# Patient Record
Sex: Male | Born: 1965 | Race: White | Hispanic: No | Marital: Married | Smoking: Never smoker
Health system: Southern US, Community
[De-identification: ages and names within clinical notes are randomized; demographics above are authoritative.]

## PROBLEM LIST (undated history)

## (undated) DIAGNOSIS — K859 Acute pancreatitis without necrosis or infection, unspecified: Secondary | ICD-10-CM

## (undated) DIAGNOSIS — I10 Essential (primary) hypertension: Secondary | ICD-10-CM

## (undated) DIAGNOSIS — Q600 Renal agenesis, unilateral: Secondary | ICD-10-CM

## (undated) DIAGNOSIS — B958 Unspecified staphylococcus as the cause of diseases classified elsewhere: Secondary | ICD-10-CM

## (undated) DIAGNOSIS — G5600 Carpal tunnel syndrome, unspecified upper limb: Secondary | ICD-10-CM

## (undated) DIAGNOSIS — E785 Hyperlipidemia, unspecified: Secondary | ICD-10-CM

## (undated) DIAGNOSIS — J189 Pneumonia, unspecified organism: Secondary | ICD-10-CM

## (undated) DIAGNOSIS — G5603 Carpal tunnel syndrome, bilateral upper limbs: Secondary | ICD-10-CM

## (undated) DIAGNOSIS — L089 Local infection of the skin and subcutaneous tissue, unspecified: Secondary | ICD-10-CM

## (undated) HISTORY — PX: VASECTOMY: SHX75

## (undated) HISTORY — DX: Acute pancreatitis without necrosis or infection, unspecified: K85.90

## (undated) HISTORY — DX: Renal agenesis, unilateral: Q60.0

## (undated) HISTORY — DX: Carpal tunnel syndrome, bilateral upper limbs: G56.03

## (undated) HISTORY — DX: Carpal tunnel syndrome, unspecified upper limb: G56.00

## (undated) HISTORY — DX: Hyperlipidemia, unspecified: E78.5

## (undated) HISTORY — DX: Essential (primary) hypertension: I10

---

## 2004-07-16 ENCOUNTER — Emergency Department: Payer: Self-pay | Admitting: Unknown Physician Specialty

## 2006-03-23 ENCOUNTER — Emergency Department: Payer: Self-pay | Admitting: Emergency Medicine

## 2007-12-18 ENCOUNTER — Emergency Department (HOSPITAL_COMMUNITY): Admission: EM | Admit: 2007-12-18 | Discharge: 2007-12-18 | Payer: Self-pay | Admitting: Family Medicine

## 2008-02-08 DIAGNOSIS — I10 Essential (primary) hypertension: Secondary | ICD-10-CM | POA: Insufficient documentation

## 2008-02-08 DIAGNOSIS — E785 Hyperlipidemia, unspecified: Secondary | ICD-10-CM

## 2008-02-08 DIAGNOSIS — G5600 Carpal tunnel syndrome, unspecified upper limb: Secondary | ICD-10-CM

## 2008-02-08 DIAGNOSIS — K859 Acute pancreatitis without necrosis or infection, unspecified: Secondary | ICD-10-CM | POA: Insufficient documentation

## 2008-02-19 ENCOUNTER — Ambulatory Visit: Payer: Self-pay | Admitting: Internal Medicine

## 2008-02-19 DIAGNOSIS — R933 Abnormal findings on diagnostic imaging of other parts of digestive tract: Secondary | ICD-10-CM | POA: Insufficient documentation

## 2008-02-19 DIAGNOSIS — R1013 Epigastric pain: Secondary | ICD-10-CM | POA: Insufficient documentation

## 2008-02-27 ENCOUNTER — Ambulatory Visit (HOSPITAL_COMMUNITY): Admission: RE | Admit: 2008-02-27 | Discharge: 2008-02-27 | Payer: Self-pay | Admitting: Internal Medicine

## 2008-03-06 ENCOUNTER — Ambulatory Visit: Payer: Self-pay | Admitting: Internal Medicine

## 2010-12-28 LAB — POCT URINALYSIS DIP (DEVICE)
Glucose, UA: NEGATIVE
Ketones, ur: NEGATIVE
Nitrite: NEGATIVE
Specific Gravity, Urine: 1.015
pH: 7.5

## 2010-12-28 LAB — COMPREHENSIVE METABOLIC PANEL
ALT: 23
Alkaline Phosphatase: 55
BUN: 8
CO2: 28
Calcium: 9.3
GFR calc non Af Amer: >60
Glucose, Bld: 114 — ABNORMAL HIGH
Potassium: 4.1
Sodium: 138

## 2010-12-28 LAB — DIFFERENTIAL
Basophils Relative: 0
Eosinophils Absolute: 0
Neutro Abs: 16.1 — ABNORMAL HIGH
Neutrophils Relative %: 89 — ABNORMAL HIGH

## 2010-12-28 LAB — CBC
HCT: 46.8
Hemoglobin: 15.8
MCHC: 33.8
RBC: 5.32

## 2010-12-28 LAB — LIPASE, BLOOD: Lipase: 59

## 2011-07-21 ENCOUNTER — Institutional Professional Consult (permissible substitution): Payer: Self-pay | Admitting: Pulmonary Disease

## 2011-07-29 ENCOUNTER — Encounter: Payer: Self-pay | Admitting: *Deleted

## 2011-07-29 ENCOUNTER — Encounter: Payer: Self-pay | Admitting: Pulmonary Disease

## 2011-07-30 ENCOUNTER — Ambulatory Visit (INDEPENDENT_AMBULATORY_CARE_PROVIDER_SITE_OTHER): Payer: 59 | Admitting: Pulmonary Disease

## 2011-07-30 ENCOUNTER — Encounter: Payer: Self-pay | Admitting: Pulmonary Disease

## 2011-07-30 VITALS — BP 132/86 | HR 89 | Temp 98.3°F | Ht 73.5 in | Wt 251.2 lb

## 2011-07-30 DIAGNOSIS — Z148 Genetic carrier of other disease: Secondary | ICD-10-CM

## 2011-07-30 NOTE — Progress Notes (Signed)
  Subjective:    Patient ID: Daniel Stone, male    DOB: 1965-12-06, 46 y.o.   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  The patient states that his father had the actual disease, and died in his 17s with lung cancer and also pulmonary problems.  However, he was a lifelong smoker.  The patient had no issues with his breathing and in his usual state of health, until February of this year.  He developed some type of sinopulmonary infection, but his chest x-ray from the urgent care center showed no infiltrates.  He was treated with antibiotics and prednisone with improvement.  He has very little cough at this time, or mucus production.  However, he feels that his breathing has not returned to his usual baseline.  He also describes a feeling of pressure in his chest.  The patient has never smoked, and has never had pulmonary function studies.  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     Review of Systems  Constitutional: Negative for fever and unexpected weight change.  HENT: Negative for ear pain, nosebleeds, congestion, sore throat, rhinorrhea, sneezing, trouble swallowing, dental problem, postnasal drip and sinus pressure.   Eyes: Negative for redness and itching.  Respiratory: Positive for cough and shortness of breath. Negative for chest tightness and wheezing.   Cardiovascular: Negative for palpitations and leg swelling.  Gastrointestinal: Negative for nausea and vomiting.  Genitourinary: Negative for dysuria.  Musculoskeletal: Negative for joint swelling.  Skin: Negative for rash.  Neurological: Negative for headaches.  Hematological: Does not bruise/bleed easily.  Psychiatric/Behavioral: Negative for dysphoric mood. The patient is not nervous/anxious.        Objective:   Physical Exam Constitutional:  Overweight male, no  acute distress  HENT:  Nares patent without discharge  Oropharynx without exudate, palate and uvula are normal  Eyes:  Perrla, eomi, no scleral icterus  Neck:  No JVD, no TMG  Cardiovascular:  Normal rate, regular rhythm, no rubs or gallops.  No murmurs        Intact distal pulses  Pulmonary :  Normal breath sounds, no stridor or respiratory distress   No rales, rhonchi, or wheezing  Abdominal:  Soft, nondistended, bowel sounds present.  No tenderness noted.   Musculoskeletal:  No lower extremity edema noted.  Lymph Nodes:  No cervical lymphadenopathy noted  Skin:  No cyanosis noted  Neurologic:  Alert, appropriate, moves all 4 extremities without obvious deficit.         Assessment & Plan:

## 2011-07-30 NOTE — Assessment & Plan Note (Signed)
The patient has been found to be an alpha one antitrypsin deficiency carrier by recent testing, however his absolute levels are at the lower end of normal.  The patient has never smoked, but also has never had pulmonary function studies.  Given the fact that his levels are at the lower range, and his father was a ZZ genotype, I think he needs to have pulmonary function studies to establish his baseline and yearly checks for a period of time.  I have also stressed to him the importance of having his children tested.

## 2011-07-30 NOTE — Patient Instructions (Signed)
Will set up for breathing tests, and will call you with results. Be sure and have your child tested for alpha one deficiency.

## 2011-08-11 ENCOUNTER — Ambulatory Visit (INDEPENDENT_AMBULATORY_CARE_PROVIDER_SITE_OTHER): Payer: 59 | Admitting: Pulmonary Disease

## 2011-08-11 DIAGNOSIS — Z148 Genetic carrier of other disease: Secondary | ICD-10-CM

## 2011-08-11 LAB — PULMONARY FUNCTION TEST

## 2011-08-11 NOTE — Progress Notes (Signed)
PFT done today. 

## 2011-08-16 ENCOUNTER — Telehealth: Payer: Self-pay | Admitting: Pulmonary Disease

## 2011-08-16 NOTE — Telephone Encounter (Signed)
LMOMTCB x1 for pt 

## 2011-08-16 NOTE — Telephone Encounter (Signed)
Please let pt know that his breathing studies are normal.  No copd.  I think he needs to have yearly testing for a few yrs.  Have him call us in one year to set up pfts with OV the same day with me.

## 2011-08-17 NOTE — Telephone Encounter (Signed)
Patient notified of PFT results per Dr. Shelle Iron and verbalized understanding. A yearly reminder has been placed for follow-up in a year with [facility] with PFT same day.

## 2011-08-19 ENCOUNTER — Encounter: Payer: Self-pay | Admitting: Pulmonary Disease

## 2012-04-25 ENCOUNTER — Encounter (HOSPITAL_COMMUNITY): Payer: Self-pay

## 2012-04-25 ENCOUNTER — Emergency Department (HOSPITAL_COMMUNITY): Payer: 59

## 2012-04-25 ENCOUNTER — Emergency Department (HOSPITAL_COMMUNITY)
Admission: EM | Admit: 2012-04-25 | Discharge: 2012-04-25 | Disposition: A | Discharge status: Home / Self Care | Payer: 59 | Attending: Emergency Medicine | Admitting: Emergency Medicine

## 2012-04-25 DIAGNOSIS — Z79899 Other long term (current) drug therapy: Secondary | ICD-10-CM | POA: Insufficient documentation

## 2012-04-25 DIAGNOSIS — E785 Hyperlipidemia, unspecified: Secondary | ICD-10-CM | POA: Insufficient documentation

## 2012-04-25 DIAGNOSIS — Z8719 Personal history of other diseases of the digestive system: Secondary | ICD-10-CM | POA: Insufficient documentation

## 2012-04-25 DIAGNOSIS — Z8669 Personal history of other diseases of the nervous system and sense organs: Secondary | ICD-10-CM | POA: Insufficient documentation

## 2012-04-25 DIAGNOSIS — Z87448 Personal history of other diseases of urinary system: Secondary | ICD-10-CM | POA: Insufficient documentation

## 2012-04-25 DIAGNOSIS — I10 Essential (primary) hypertension: Secondary | ICD-10-CM | POA: Insufficient documentation

## 2012-04-25 DIAGNOSIS — R319 Hematuria, unspecified: Secondary | ICD-10-CM | POA: Insufficient documentation

## 2012-04-25 LAB — POCT I-STAT, CHEM 8
Creatinine, Ser: 1 mg/dL (ref 0.50–1.35)
Glucose, Bld: 99 mg/dL (ref 70–99)
Hemoglobin: 15.3 g/dL (ref 13.0–17.0)
Potassium: 3.7 mEq/L (ref 3.5–5.1)

## 2012-04-25 LAB — URINALYSIS, ROUTINE W REFLEX MICROSCOPIC
Bilirubin Urine: NEGATIVE
Glucose, UA: NEGATIVE mg/dL
Specific Gravity, Urine: 1.018 (ref 1.005–1.030)
pH: 7 (ref 5.0–8.0)

## 2012-04-25 LAB — CBC WITH DIFFERENTIAL/PLATELET
Eosinophils Absolute: 0.1 10*3/uL (ref 0.0–0.7)
Eosinophils Relative: 2 % (ref 0–5)
HCT: 42.8 % (ref 39.0–52.0)
Hemoglobin: 15.2 g/dL (ref 13.0–17.0)
Lymphs Abs: 1.9 10*3/uL (ref 0.7–4.0)
MCH: 30.9 pg (ref 26.0–34.0)
MCV: 87 fL (ref 78.0–100.0)
Monocytes Relative: 9 % (ref 3–12)
Neutrophils Relative %: 64 % (ref 43–77)
RBC: 4.92 MIL/uL (ref 4.22–5.81)

## 2012-04-25 NOTE — ED Notes (Signed)
Pt with c/o bright red blood in urine since last night

## 2012-04-25 NOTE — ED Notes (Signed)
Call to lab regarding urine, lab reports it has not been run yet, will do so now

## 2012-04-25 NOTE — ED Notes (Signed)
Patient transported to CT 

## 2012-04-25 NOTE — ED Provider Notes (Addendum)
Developed hematuria onset last night, gross blood per patient also had hematuria this morning although less bloody. Patient feels well had slight cramping of abdomen yesterday which she's had since he was a small child he is presently asymptomatic no fever no other complaint on exam looks well alert nontoxic lungs clear auscultation heart regular in rhythm abdomen nontender genitalia normal circumcised male. No flank tenderness Spoke with [facility] . Plan patient to call office today to schedule next available office visit Results for orders placed during the hospital encounter of 04/25/12  URINALYSIS, ROUTINE W REFLEX MICROSCOPIC      Component Value Range   Color, Urine RED (*) YELLOW   APPearance CLOUDY (*) CLEAR   Specific Gravity, Urine 1.018  1.005 - 1.030   pH 7.0  5.0 - 8.0   Glucose, UA NEGATIVE  NEGATIVE mg/dL   Hgb urine dipstick LARGE (*) NEGATIVE   Bilirubin Urine NEGATIVE  NEGATIVE   Ketones, ur 15 (*) NEGATIVE mg/dL   Protein, ur 30 (*) NEGATIVE mg/dL   Urobilinogen, UA 0.2  0.0 - 1.0 mg/dL   Nitrite NEGATIVE  NEGATIVE   Leukocytes, UA SMALL (*) NEGATIVE  CBC WITH DIFFERENTIAL      Component Value Range   WBC 7.4  4.0 - 10.5 K/uL   RBC 4.92  4.22 - 5.81 MIL/uL   Hemoglobin 15.2  13.0 - 17.0 g/dL   HCT 16.1  09.6 - 04.5 %   MCV 87.0  78.0 - 100.0 fL   MCH 30.9  26.0 - 34.0 pg   MCHC 35.5  30.0 - 36.0 g/dL   RDW 40.9  81.1 - 91.4 %   Platelets 217  150 - 400 K/uL   Neutrophils Relative 64  43 - 77 %   Neutro Abs 4.8  1.7 - 7.7 K/uL   Lymphocytes Relative 25  12 - 46 %   Lymphs Abs 1.9  0.7 - 4.0 K/uL   Monocytes Relative 9  3 - 12 %   Monocytes Absolute 0.6  0.1 - 1.0 K/uL   Eosinophils Relative 2  0 - 5 %   Eosinophils Absolute 0.1  0.0 - 0.7 K/uL   Basophils Relative 0  0 - 1 %   Basophils Absolute 0.0  0.0 - 0.1 K/uL  POCT I-STAT, CHEM 8      Component Value Range   Sodium 141  135 - 145 mEq/L   Potassium 3.7  3.5 - 5.1 mEq/L   Chloride 103  96 - 112  mEq/L   BUN 9  6 - 23 mg/dL   Creatinine, Ser 7.82  0.50 - 1.35 mg/dL   Glucose, Bld 99  70 - 99 mg/dL   Calcium, Ion 9.56  2.13 - 1.23 mmol/L   TCO2 28  0 - 100 mmol/L   Hemoglobin 15.3  13.0 - 17.0 g/dL   HCT 08.6  57.8 - 46.9 %  URINE MICROSCOPIC-ADD ON      Component Value Range   WBC, UA 0-2  <3 WBC/hpf   RBC / HPF TOO NUMEROUS TO COUNT  <3 RBC/hpf   Ct Abdomen Pelvis Wo Contrast  04/25/2012  *RADIOLOGY REPORT*  Clinical Data: Hematuria; abdominal cramping  CT ABDOMEN AND PELVIS WITHOUT CONTRAST  Technique:  Multidetector CT imaging of the abdomen and pelvis was performed following the standard protocol without intravenous contrast.  Comparison: December 18, 2007  Findings: The lung bases are clear.  No focal liver lesions are identified on this non intravenous  contrast enhanced study.  There is no biliary duct dilatation.  Spleen, pancreas, and adrenals appear normal.  The right kidney is atrophic.  There is a 2 mm calculus in the right kidney which is stable compared to prior study.  There is no renal mass in the right.  Left kidney is mildly prominent in size, consistent with compensatory hypertrophy.  There is a 1 cm cyst arising from the lateral upper pole left kidney.  There is no appreciable left sided hydronephrosis or intrarenal calculus.  There is no left ureteral calculus or ureterectasis.  In the pelvis, the urinary bladder wall is mildly thickened.  There is no pelvic mass or fluid collection.  Appendix appears normal.  There is no bowel obstruction.  No free air or portal venous air. There is no ascites, adenopathy, or abscess in the abdomen or pelvis.  Aorta is nonaneurysmal.  There are no blastic or lytic bone lesions.  IMPRESSION: Atrophic right kidney containing small calculus, stable.  Left kidney is mildly enlarged, probably due to compensatory hypertrophy.  There is no left renal hydronephrosis or calculus. No left ureteral calculus.  Mild urinary bladder wall thickening may  indicate cystitis.  No bowel obstruction or abscess.  Appendix appears normal.  Note that there is no appendiceal or periappendiceal inflammation at this time.   Original Report Authenticated By: Bretta Bang, M.D.      Doug Sou, MD   04/25/12 [id number]  Doug Sou, MD 04/25/12 [phone]

## 2012-04-25 NOTE — ED Provider Notes (Signed)
History     CSN: [id number]  Arrival date & time 04/25/12  0706   First MD Initiated Contact with Patient 04/25/12 [phone]      Chief Complaint  Patient presents with  . Hematuria    (Consider location/radiation/quality/duration/timing/severity/associated sxs/prior treatment) HPI Comments: 47 y.o. Male presents today complaining of acute onset gross hematuria x2 (once last night and once this morning). Pt has taken no interventions and nothing makes it better or worse. 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 Pt admits feeling this similar type of cramping prior to the hematuria. Pt denies fever, frequency, urgency, flank pain, abdominal pain, nausea/vomiting, history of kidney stones, nausea/vomiting, dizziness, numbness, or tingling.      Patient is a 47 y.o. male presenting with hematuria.  Hematuria Irritative symptoms do not include urgency. Pertinent negatives include no abdominal pain, dysuria, fever, flank pain, nausea or vomiting.    Past Medical History  Diagnosis Date  . HTN (hypertension)   . CTS (carpal tunnel syndrome)   . Pancreatitis   . Unilateral agenesis of kidney   . Hyperlipidemia     Past Surgical History  Procedure Date  . Vasectomy     Family History  Problem Relation Age of Onset  . Coronary artery disease Paternal Grandmother   . Emphysema Maternal Aunt   . Emphysema Maternal Uncle   . Lung cancer Father   . Alpha-1 antitrypsin deficiency Father   . Alpha-1 antitrypsin deficiency Paternal Aunt     History  Substance Use Topics  . Smoking status: Never Smoker   . Smokeless tobacco: Not on file  . Alcohol Use: Yes      Review of Systems  Constitutional: Negative for fever and diaphoresis.  HENT: Negative for neck pain and neck stiffness.   Eyes: Negative for visual disturbance.  Respiratory: Negative for apnea, chest tightness and shortness of  breath.   Cardiovascular: Negative for chest pain and palpitations.  Gastrointestinal: Negative for nausea, vomiting, abdominal pain, diarrhea and constipation.  Genitourinary: Positive for hematuria. Negative for dysuria, urgency, flank pain, discharge and penile pain.       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   Musculoskeletal: Negative for gait problem.  Skin: Negative for rash.  Neurological: Negative for dizziness, weakness, light-headedness, numbness and headaches.    Allergies  Review of patient's allergies indicates no known allergies.  Home Medications   Current Outpatient Rx  Name  Route  Sig  Dispense  Refill  . GOODY HEADACHE PO   Oral   Take 2 packets by mouth 2 (two) times daily as needed. For pain         . CAPTOPRIL-HYDROCHLOROTHIAZIDE 50-15 MG PO TABS   Oral   Take 1 tablet by mouth 2 (two) times daily.          Marland Kitchen SIMVASTATIN 20 MG PO TABS   Oral   Take 20 mg by mouth every evening.           BP 151/96  Pulse 78  Temp 97.5 F (36.4 C) (Oral)  Resp 18  SpO2 96%  Physical Exam  Nursing note and vitals reviewed. Constitutional: He is oriented to person, place, and time. He appears well-developed and well-nourished. No distress.  HENT:  Head: Normocephalic and atraumatic.  Eyes: EOM are normal. Pupils are equal, round, and reactive to light.  Neck: Normal range of  motion. Neck supple.       No meningeal signs  Cardiovascular: Normal rate, regular rhythm and normal heart sounds.  Exam reveals no gallop and no friction rub.   No murmur heard. Pulmonary/Chest: Effort normal and breath sounds normal. No respiratory distress. He has no wheezes. He has no rales. He exhibits no tenderness.  Abdominal: Soft. Bowel sounds are normal. He exhibits no distension. There is no tenderness. There is no rebound and no guarding.  Genitourinary:       No CVA tenderness  Musculoskeletal: Normal range of  motion. He exhibits no edema and no tenderness.  Neurological: He is alert and oriented to person, place, and time. No cranial nerve deficit.  Skin: Skin is warm and dry. He is not diaphoretic. No erythema.    ED Course  Procedures (including critical care time) No results found.  Results for orders placed during the hospital encounter of 04/25/12  URINALYSIS, ROUTINE W REFLEX MICROSCOPIC      Component Value Range   Color, Urine RED (*) YELLOW   APPearance CLOUDY (*) CLEAR   Specific Gravity, Urine 1.018  1.005 - 1.030   pH 7.0  5.0 - 8.0   Glucose, UA NEGATIVE  NEGATIVE mg/dL   Hgb urine dipstick LARGE (*) NEGATIVE   Bilirubin Urine NEGATIVE  NEGATIVE   Ketones, ur 15 (*) NEGATIVE mg/dL   Protein, ur 30 (*) NEGATIVE mg/dL   Urobilinogen, UA 0.2  0.0 - 1.0 mg/dL   Nitrite NEGATIVE  NEGATIVE   Leukocytes, UA SMALL (*) NEGATIVE  CBC WITH DIFFERENTIAL      Component Value Range   WBC 7.4  4.0 - 10.5 K/uL   RBC 4.92  4.22 - 5.81 MIL/uL   Hemoglobin 15.2  13.0 - 17.0 g/dL   HCT 16.1  09.6 - 04.5 %   MCV 87.0  78.0 - 100.0 fL   MCH 30.9  26.0 - 34.0 pg   MCHC 35.5  30.0 - 36.0 g/dL   RDW 40.9  81.1 - 91.4 %   Platelets 217  150 - 400 K/uL   Neutrophils Relative 64  43 - 77 %   Neutro Abs 4.8  1.7 - 7.7 K/uL   Lymphocytes Relative 25  12 - 46 %   Lymphs Abs 1.9  0.7 - 4.0 K/uL   Monocytes Relative 9  3 - 12 %   Monocytes Absolute 0.6  0.1 - 1.0 K/uL   Eosinophils Relative 2  0 - 5 %   Eosinophils Absolute 0.1  0.0 - 0.7 K/uL   Basophils Relative 0  0 - 1 %   Basophils Absolute 0.0  0.0 - 0.1 K/uL  POCT I-STAT, CHEM 8      Component Value Range   Sodium 141  135 - 145 mEq/L   Potassium 3.7  3.5 - 5.1 mEq/L   Chloride 103  96 - 112 mEq/L   BUN 9  6 - 23 mg/dL   Creatinine, Ser 7.82  0.50 - 1.35 mg/dL   Glucose, Bld 99  70 - 99 mg/dL   Calcium, Ion 9.56  2.13 - 1.23 mmol/L   TCO2 28  0 - 100 mmol/L   Hemoglobin 15.3  13.0 - 17.0 g/dL   HCT 08.6  57.8 - 46.9 %  URINE  MICROSCOPIC-ADD ON      Component Value Range   WBC, UA 0-2  <3 WBC/hpf   RBC / HPF TOO NUMEROUS TO COUNT  <3 RBC/hpf  Ct Abdomen Pelvis Wo Contrast  04/25/2012  *RADIOLOGY REPORT*  Clinical Data: Hematuria; abdominal cramping  CT ABDOMEN AND PELVIS WITHOUT CONTRAST  Technique:  Multidetector CT imaging of the abdomen and pelvis was performed following the standard protocol without intravenous contrast.  Comparison: December 18, 2007  Findings: The lung bases are clear.  No focal liver lesions are identified on this non intravenous contrast enhanced study.  There is no biliary duct dilatation.  Spleen, pancreas, and adrenals appear normal.  The right kidney is atrophic.  There is a 2 mm calculus in the right kidney which is stable compared to prior study.  There is no renal mass in the right.  Left kidney is mildly prominent in size, consistent with compensatory hypertrophy.  There is a 1 cm cyst arising from the lateral upper pole left kidney.  There is no appreciable left sided hydronephrosis or intrarenal calculus.  There is no left ureteral calculus or ureterectasis.  In the pelvis, the urinary bladder wall is mildly thickened.  There is no pelvic mass or fluid collection.  Appendix appears normal.  There is no bowel obstruction.  No free air or portal venous air. There is no ascites, adenopathy, or abscess in the abdomen or pelvis.  Aorta is nonaneurysmal.  There are no blastic or lytic bone lesions.  IMPRESSION: Atrophic right kidney containing small calculus, stable.  Left kidney is mildly enlarged, probably due to compensatory hypertrophy.  There is no left renal hydronephrosis or calculus. No left ureteral calculus.  Mild urinary bladder wall thickening may indicate cystitis.  No bowel obstruction or abscess.  Appendix appears normal.  Note that there is no appendiceal or periappendiceal inflammation at this time.   Original Report Authenticated By: Bretta Bang, M.D.    Diagnosis:  hematuria    MDM  CBC/Chem 8 unremarkable. Urinalysis positive for hematuria. CT shows 2mm stone in the right kidney, stable compared to a previous study, and a 1 cm cyst arising form the lateral upper pole left kidney. No left renal hydronephrosis. No uretal blockage. At this time there does not appear to be any evidence of an acute emergency medical condition and the patient appears stable for discharge to be followed up today at [facility]. Diagnosis was discussed with patient who verbalizes understanding and is agreeable to discharge. Pt case consulted with Dr. Ethelda Chick as well.   Glade Nurse, PA-C 04/25/12 2142

## 2012-04-27 NOTE — ED Provider Notes (Signed)
Medical screening examination/treatment/procedure(s) were conducted as a shared visit with non-physician practitioner(s) and myself.  I personally evaluated the patient during the encounter   , MD 04/27/12 0655 

## 2012-08-02 ENCOUNTER — Telehealth: Payer: Self-pay | Admitting: Pulmonary Disease

## 2012-08-02 NOTE — Telephone Encounter (Signed)
Called pt x's 3 to make next ov per recall.  Pt never returned calls.  Mailed recall letter 08/02/12. Emily E McAlister °

## 2014-10-29 ENCOUNTER — Encounter: Payer: Self-pay | Admitting: Internal Medicine

## 2015-01-22 ENCOUNTER — Institutional Professional Consult (permissible substitution): Payer: Self-pay | Admitting: Neurology

## 2015-01-22 ENCOUNTER — Telehealth: Payer: Self-pay

## 2015-01-22 NOTE — Telephone Encounter (Signed)
Patient cancelled his appt the day of appt.

## 2015-02-24 ENCOUNTER — Ambulatory Visit: Payer: Self-pay | Admitting: Internal Medicine

## 2015-09-10 ENCOUNTER — Encounter: Payer: Self-pay | Admitting: Internal Medicine

## 2015-11-14 ENCOUNTER — Encounter: Payer: Self-pay | Admitting: Internal Medicine

## 2015-11-14 ENCOUNTER — Ambulatory Visit (AMBULATORY_SURGERY_CENTER): Payer: Self-pay | Admitting: *Deleted

## 2015-11-14 VITALS — Ht 73.0 in | Wt 257.2 lb

## 2015-11-14 DIAGNOSIS — Z1211 Encounter for screening for malignant neoplasm of colon: Secondary | ICD-10-CM

## 2015-11-14 MED ORDER — NA SULFATE-K SULFATE-MG SULF 17.5-3.13-1.6 GM/177ML PO SOLN: 1.0000 | Freq: Once | ORAL | 0 refills | Status: AC

## 2015-11-14 NOTE — Progress Notes (Signed)
Denies allergies to eggs or soy products. Denies complications with sedation or anesthesia. Denies O2 use. Denies use of diet or weight loss medications.  Emmi instructions given for colonoscopy.  

## 2015-11-28 ENCOUNTER — Ambulatory Visit (AMBULATORY_SURGERY_CENTER): Payer: BLUE CROSS/BLUE SHIELD | Admitting: Internal Medicine

## 2015-11-28 ENCOUNTER — Encounter: Payer: Self-pay | Admitting: Internal Medicine

## 2015-11-28 VITALS — BP 119/83 | HR 66 | Temp 98.9°F | Resp 17 | Ht 73.0 in | Wt 257.0 lb

## 2015-11-28 DIAGNOSIS — D123 Benign neoplasm of transverse colon: Secondary | ICD-10-CM

## 2015-11-28 DIAGNOSIS — D122 Benign neoplasm of ascending colon: Secondary | ICD-10-CM

## 2015-11-28 DIAGNOSIS — Z1211 Encounter for screening for malignant neoplasm of colon: Secondary | ICD-10-CM | POA: Diagnosis present

## 2015-11-28 DIAGNOSIS — D120 Benign neoplasm of cecum: Secondary | ICD-10-CM

## 2015-11-28 MED ORDER — SODIUM CHLORIDE 0.9 % IV SOLN: 500.0000 mL | INTRAVENOUS | Status: AC

## 2015-11-28 NOTE — Patient Instructions (Signed)
YOU HAD AN ENDOSCOPIC PROCEDURE TODAY AT [facility]:   Refer to the procedure report that was given to you for any specific questions about what was found during the examination.  If the procedure report does not answer your questions, please call your gastroenterologist to clarify.  If you requested that your care partner not be given the details of your procedure findings, then the procedure report has been included in a sealed envelope for you to review at your convenience later.  YOU SHOULD EXPECT: Some feelings of bloating in the abdomen. Passage of more gas than usual.  Walking can help get rid of the air that was put into your GI tract during the procedure and reduce the bloating. If you had a lower endoscopy (such as a colonoscopy or flexible sigmoidoscopy) you may notice spotting of blood in your stool or on the toilet paper. If you underwent a bowel prep for your procedure, you may not have a normal bowel movement for a few days.  Please Note:  You might notice some irritation and congestion in your nose or some drainage.  This is from the oxygen used during your procedure.  There is no need for concern and it should clear up in a day or so.  SYMPTOMS TO REPORT IMMEDIATELY:   Following lower endoscopy (colonoscopy or flexible sigmoidoscopy):  Excessive amounts of blood in the stool  Significant tenderness or worsening of abdominal pains  Swelling of the abdomen that is new, acute  Fever of 100F or higher   For urgent or emergent issues, a gastroenterologist can be reached at any hour by calling [phone].   DIET:  We do recommend a small meal at first, but then you may proceed to your regular diet.  Drink plenty of fluids but you should avoid alcoholic beverages for 24 hours.  ACTIVITY:  You should plan to take it easy for the rest of today and you should NOT DRIVE or use heavy machinery until tomorrow (because of the sedation medicines used during the test).     FOLLOW UP: Our staff will call the number listed on your records the next business day following your procedure to check on you and address any questions or concerns that you may have regarding the information given to you following your procedure. If we do not reach you, we will leave a message.  However, if you are feeling well and you are not experiencing any problems, there is no need to return our call.  We will assume that you have returned to your regular daily activities without incident.  If any biopsies were taken you will be contacted by phone or by letter within the next 1-3 weeks.  Please call us at [phone] if you have not heard about the biopsies in 3 weeks.    SIGNATURES/CONFIDENTIALITY: You and/or your care partner have signed paperwork which will be entered into your electronic medical record.  These signatures attest to the fact that that the information above on your After Visit Summary has been reviewed and is understood.  Full responsibility of the confidentiality of this discharge information lies with you and/or your care-partner.  Polyp, diverticulosis, and high fiber information given.  Repeat colonoscopy in 3-5 years.

## 2015-11-28 NOTE — Progress Notes (Signed)
Called to room to assist during endoscopic procedure.  Patient ID and intended procedure confirmed with present staff. Received instructions for my participation in the procedure from the performing physician.  

## 2015-11-28 NOTE — Progress Notes (Signed)
Report to PACU, RN, vss, BBS= Clear.  

## 2015-11-28 NOTE — Op Note (Signed)
[place] Patient Name: Daniel Stone Procedure Date: 11/28/2015 9:36 AM MRN: [id number] Endoscopist: Docia Chuck. Henrene Pastor , MD Age: 50 Referring MD:  Date of Birth: 10-Jun-1965 Gender: Male Account #: [id number] Procedure:                Colonoscopy, with cold biopsy x 1 polypectomy and                            with cold snare polypectomy X3 Indications:              Screening for colorectal malignant neoplasm Medicines:                Monitored Anesthesia Care Procedure:                Pre-Anesthesia Assessment:                           - Prior to the procedure, a History and Physical                            was performed, and patient medications and                            allergies were reviewed. The patient's tolerance of                            previous anesthesia was also reviewed. The risks                            and benefits of the procedure and the sedation                            options and risks were discussed with the patient.                            All questions were answered, and informed consent                            was obtained. Prior Anticoagulants: The patient has                            taken no previous anticoagulant or antiplatelet                            agents. ASA Grade Assessment: II - A patient with                            mild systemic disease. After reviewing the risks                            and benefits, the patient was deemed in                            satisfactory condition to undergo the procedure.  After obtaining informed consent, the colonoscope                            was passed under direct vision. Throughout the                            procedure, the patient's blood pressure, pulse, and                            oxygen saturations were monitored continuously. The                            Model PCF-H190L [id number]) scope was introduced   through the anus and advanced to the the cecum,                            identified by appendiceal orifice and ileocecal                            valve. The ileocecal valve, appendiceal orifice,                            and rectum were photographed. The quality of the                            bowel preparation was excellent. The colonoscopy                            was performed without difficulty. The patient                            tolerated the procedure well. The bowel preparation                            used was SUPREP. Scope In: 9:41:28 AM Scope Out: 9:59:39 AM Scope Withdrawal Time: 0 hours 16 minutes 14 seconds  Total Procedure Duration: 0 hours 18 minutes 11 seconds  Findings:                 A less than 1 mm polyp was found in the cecum.                            Biopsy removal with a cold forceps for histology.                           Three polyps were found in the transverse colon and                            ascending colon. The polyps were 2 to 5 mm in size.                            These polyps were removed with a cold snare.  Resection and retrieval were complete.                           Diverticula were found in the left colon.                           The exam was otherwise without abnormality on                            direct and retroflexion views. Complications:            No immediate complications. Estimated blood loss:                            None. Estimated Blood Loss:     Estimated blood loss: none. Impression:               - One less than 1 mm polyp in the cecum. Biopsied.                           - Three 2 to 5 mm polyps in the transverse colon                            and in the ascending colon, removed with a cold                            snare. Resected and retrieved.                           - Diverticulosis in the left colon.                           - The examination was otherwise normal  on direct                            and retroflexion views. Recommendation:           - Repeat colonoscopy in 3 - 5 years for                            surveillance.                           - Patient has a contact number available for                            emergencies. The signs and symptoms of potential                            delayed complications were discussed with the                            patient. Return to normal activities tomorrow.                            Written discharge instructions were provided  to the                            patient.                           - Resume previous diet.                           - Continue present medications.                           - Await pathology results. Docia Chuck. Henrene Pastor, MD 11/28/2015 10:05:44 AM This report has been signed electronically.

## 2015-12-02 ENCOUNTER — Telehealth: Payer: Self-pay

## 2015-12-02 NOTE — Telephone Encounter (Signed)
Left a message at [phone] for the pt to call us if any questions or concerns. maw

## 2015-12-04 ENCOUNTER — Encounter: Payer: Self-pay | Admitting: Internal Medicine

## 2020-02-07 ENCOUNTER — Encounter: Payer: Self-pay | Admitting: Nurse Practitioner

## 2020-02-28 ENCOUNTER — Ambulatory Visit (INDEPENDENT_AMBULATORY_CARE_PROVIDER_SITE_OTHER): Payer: Managed Care, Other (non HMO) | Admitting: Nurse Practitioner

## 2020-02-28 ENCOUNTER — Encounter: Payer: Self-pay | Admitting: Nurse Practitioner

## 2020-02-28 VITALS — BP 124/80 | HR 78 | Ht 73.0 in | Wt 250.0 lb

## 2020-02-28 DIAGNOSIS — K5900 Constipation, unspecified: Secondary | ICD-10-CM

## 2020-02-28 DIAGNOSIS — K6289 Other specified diseases of anus and rectum: Secondary | ICD-10-CM | POA: Diagnosis not present

## 2020-02-28 DIAGNOSIS — K625 Hemorrhage of anus and rectum: Secondary | ICD-10-CM | POA: Diagnosis not present

## 2020-02-28 MED ORDER — HYDROCORTISONE (PERIANAL) 2.5 % EX CREA: 1.0000 "application " | TOPICAL_CREAM | Freq: Two times a day (BID) | CUTANEOUS | 1 refills | Status: DC

## 2020-02-28 NOTE — Patient Instructions (Addendum)
If you are age 56 or older, your body mass index should be between 23-30. Your Body mass index is 32.98 kg/m. If this is out of the aforementioned range listed, please consider follow up with your Primary Care Provider.  If you are age 54 or younger, your body mass index should be between 19-25. Your Body mass index is 32.98 kg/m. If this is out of the aformentioned range listed, please consider follow up with your Primary Care Provider.   Start Benefiber in 8 ounces of water once daily.    Drink 48-64 ounces of water daily.  Start a stool softener at daily at bedtime.  We have sent the following medications to your pharmacy for you to pick up at your convenience: Anusol cream as needed every night at bedtime for seven days.  Call us for any recurrent rectal pain and rectal bleeding.  Thank you for choosing me and [facility].  [name] , NP

## 2020-02-28 NOTE — Progress Notes (Signed)
Assessment and plan reviewed 

## 2020-02-28 NOTE — Progress Notes (Signed)
ASSESSMENT AND PLAN    #  54 yo male with rectal pain / minor rectal bleeding a few weeks ago in setting of severe constipation possibly caused by antibiotics. Symptoms resolved. He may of had a small fissure . On exam I cannot appreciate a fissure and no anorectal pain on digital exam. Bleeding may have been from internal hemorrhoids and / or a fissure.  --Will treat empirically for internal hemorrhoids with steroid cream per rectum x 7 days --If any recurrent rectal pain or rectal bleeding patient will call us.  --Treat constipation --He had a complete screening colonoscopy with excellent bowel prep in Sept 2017  # Constipation. Etiology unclear. Initially patient thought to be secondary to antibiotics for a skin infection but recently became constipated again after stopping stool softener.  --Recommend daily fiber, continue nightly stool softener, increase fluid intake to 48-64 oz water daily. Call if no improvement  # Hx of colon polyps, 4 small sessile serrated / adenoma in 2017. He is scheduled for recall colonoscopy Sept 2022.   HISTORY OF PRESENT ILLNESS     Primary Gastroenterologist :Daniel Shorts, MD   Chief Complaint : recent rectal bleeding and rectal pain.   Daniel Stone is a 54 y.o. male with PMH / [place] significant for,  but not necessarily limited to:  Hyperlipidemia, insomnia, hypertension, remote pancreatitis ( possible)  Patient referred by PCP for evaluation of rectal discomfort and rectal bleeding which has now resolved. .  Several weeks ago patient had an infected skin lesion on his back, prescribed antibiotics by Dermatology and subsequently developed constipation. Patient recalls having a very hard stool which required a lot of straining to pass. It was at this time he developed rectal discomfort. Within a day or two he noticed blood on tissue when wiping after BM. He continued to have rectal pain and a small amount of blood for 2-3 days thereafter. He started  a  stool softener which helped the constipation. He had no further rectal bleeding but the perianal discomfort lingered for a while longer but eventually resolved.  He stopped taking the stool softener after a couple of weeks and had a another hard stool two days ago. So far he hasn't had any recurrent bleeding or pain. Historically patient hasn't had problems with constipation. No abdominal pain. Weight stable.  `                     `  Data Reviewed: 02/06/2020 Hemosure negative  Previous Endoscopic Evaluations / Pertinent Studies:   Sept 2017 Screening colonoscopy  --complete exam, excellent prep -One less than 1 mm polyp in the cecum. Biopsied. - Three 2 to 5 mm polyps in the transverse colon and in the ascending colon, removed with a cold snare. Resected and retrieved. - Diverticulosis in the left colon. - The examination was otherwise normal on direct and retroflexion views.  Surgical [P], cecum, polyp - BENIGN COLONIC MUCOSA WITH LYMPHOID AGGREGATE. - NO DYSPLASIA OR MALIGNANCY. 2. Surgical [P], ascending, polyp (2) - SESSILE SERRATED POLYP/ADENOMA (X2 FRAGMENTS). - NO DYSPLASIA OR MALIGNANCY. 3. Surgical [P], transverse, polyp - SESSILE SERRATED POLYP/ADENOMA (X3 FRAGMENTS). - NO DYSPLASIA OR MALIGNANCY.   Past Medical History:  Diagnosis Date  . Carpal tunnel syndrome on both sides   . CTS (carpal tunnel syndrome)   . HTN (hypertension)   . Hyperlipidemia   . Pancreatitis   . Unilateral agenesis of kidney      Past Surgical  History:  Procedure Laterality Date  . VASECTOMY     Family History  Problem Relation Age of Onset  . Stroke Mother   . Aneurysm Mother   . Coronary artery disease Paternal Grandmother   . Emphysema Maternal Aunt   . Emphysema Maternal Uncle   . Lung cancer Father   . Alpha-1 antitrypsin deficiency Father   . Alpha-1 antitrypsin deficiency Paternal Aunt   . Colon cancer Neg Hx   . Esophageal cancer Neg Hx   . Rectal cancer Neg Hx   .  Stomach cancer Neg Hx    Social History   Tobacco Use  . Smoking status: Never Smoker  . Smokeless tobacco: Former Network engineer Use Topics  . Alcohol use: Yes    Alcohol/week: 15.0 - 21.0 standard drinks    Types: 15 - 21 Cans of beer per week  . Drug use: No   Current Outpatient Medications  Medication Sig Dispense Refill  . Aspirin-Acetaminophen-Caffeine (GOODY HEADACHE PO) Take 2 packets by mouth 2 (two) times daily as needed. For pain    . lisinopril-hydrochlorothiazide (PRINZIDE,ZESTORETIC) 10-12.5 MG tablet Take 1 tablet by mouth daily.  1  . oxyCODONE-acetaminophen (PERCOCET/ROXICET) 5-325 MG tablet Take 1 tablet by mouth 3 (three) times daily as needed for severe pain.    . simvastatin (ZOCOR) 20 MG tablet Take 20 mg by mouth every evening.     Current Facility-Administered Medications  Medication Dose Route Frequency Provider Last Rate Last Admin  . 0.9 %  sodium chloride infusion  500 mL Intravenous Continuous Irene Shipper, MD       No Known Allergies   Review of Systems: All systems reviewed and negative except where noted in HPI.   PHYSICAL EXAM :    Wt Readings from Last 3 Encounters:  11/28/15 257 lb (116.6 kg)  11/14/15 257 lb 3.2 oz (116.7 kg)  07/30/11 251 lb 3.2 oz (113.9 kg)    BP 124/80 (BP Location: Left Arm, Patient Position: Sitting)   Pulse 78   Ht 6\' 1"  (1.854 m)   Wt 250 lb (113.4 kg)   SpO2 98%   BMI 32.98 kg/m  Constitutional:  Pleasant well developed male in no acute distress. Psychiatric: Normal mood and affect. Behavior is normal. EENT: Pupils normal.  Conjunctivae are normal. No scleral icterus. Neck supple.  Cardiovascular: Normal rate, regular rhythm. No edema Pulmonary/chest: Effort normal and breath sounds normal. No wheezing, rales or rhonchi. Abdominal: Soft, nondistended, nontender. Bowel sounds active throughout. There are no masses palpable. No hepatomegaly. Rectal: no obvious perianal fissure. No external hemorrhoids.  He had no perianal pain on DRE.  Neurological: Alert and oriented to person place and time. Skin: Skin is warm and dry. No rashes noted.  [name], NP  02/28/2020, 8:34 AM  Cc:  Referring Provider Leanna Battles, MD

## 2020-04-27 NOTE — Progress Notes (Signed)
04/27/2020 Daniel Stone [id number] 1965/10/04   Chief Complaint: lower abdominal pain   History of Present Illness: Daniel Stone is a 55 year old male hypertension, hyperlipidemia, pancreatitis and congenital atrophic right kidney.   He was last seen in our office by [name] 03/19/2020 for further evaluation regarding constipation and rectal bleeding.  At that time, his exam was negative for an obvious anal fissure and it was thought his rectal bleeding was most likely due to internal hemorrhoids in setting of constipation.  He presents today for further evaluation regarding lower abdominal pain which started 2 months ago which has progressively worsened over the paste 2 weeks. He first noticed the lower abdominal pain one day after he pushed heavy paper rolls on a shaft at work, felt he most likely had muscle strain. His central lower abdominal pain is worse upon awakening in the morning and when he sits down and is less noticeable when he is busy on his feet throughout the work day. He awakened last night with 6 out of 10 central lower abdominal pain. No fever, sweats or chills.  No difficulty with urination.  Since his last office visit, he reported seeing a small amount of bright red blood on the toile tissue once about 2 weeks ago when he stopped taking a stool softener. He went back on the stool softener twice daily and his is passing a normal formed brown BM daily. No further rectal bleeding. He underwent a colonoscopy by Dr. Henrene Pastor 11/28/2015 which identified 1 polyp removed from the cecum but biopsy showed benign colonic mucosa, 3 sessile serrated/adenomatous polyps were removed from the transverse and ascending colon and sigmoid diverticulosis was noted.  He was advised to repeat a colonoscopy 11/2020.  No family history of colorectal cancer.  No NSAID use. His wife is present.    Colonoscopy 11/28/2015 by Dr. Henrene Pastor. - One less than 1 mm polyp in the cecum. Biopsied. - Three 2 to  5 mm polyps in the transverse colon and in the ascending colon, removed with a cold snare. Resected and retrieved. - Diverticulosis in the left colon. - The examination was otherwise normal on direct and retroflexion views. 1. Surgical [P], cecum, polyp - BENIGN COLONIC MUCOSA WITH LYMPHOID AGGREGATE. - NO DYSPLASIA OR MALIGNANCY. 2. Surgical [P], ascending, polyp (2) - SESSILE SERRATED POLYP/ADENOMA (X2 FRAGMENTS). - NO DYSPLASIA OR MALIGNANCY. 3. Surgical [P], transverse, polyp - SESSILE SERRATED POLYP/ADENOMA (X3 FRAGMENTS). - NO DYSPLASIA OR MALIGNANCY.  Current Outpatient Medications on File Prior to Visit  Medication Sig Dispense Refill  . Docusate Calcium (STOOL SOFTENER PO) Take 1 tablet by mouth daily.    Marland Kitchen lisinopril-hydrochlorothiazide (PRINZIDE,ZESTORETIC) 10-12.5 MG tablet Take 1 tablet by mouth daily.  1  . simvastatin (ZOCOR) 20 MG tablet Take 20 mg by mouth every evening.     Current Facility-Administered Medications on File Prior to Visit  Medication Dose Route Frequency Provider Last Rate Last Admin  . 0.9 %  sodium chloride infusion  500 mL Intravenous Continuous Irene Shipper, MD       No Known Allergies   Current Medications, Allergies, Past Medical History, Past Surgical History, Family History and Social History were reviewed in [employer] record.   Review of Systems:   Constitutional: Negative for fever, sweats, chills or weight loss.  Respiratory: Negative for shortness of breath.   Cardiovascular: Negative for chest pain, palpitations and leg swelling.  Gastrointestinal: See HPI.  Musculoskeletal: Negative for back pain or  muscle aches.  Neurological: Negative for dizziness, headaches or paresthesias.    Physical Exam: BP (!) 138/102 (BP Location: Left Arm, Patient Position: Sitting, Cuff Size: Large)   Pulse 80   Ht 6' (1.829 m)   Wt 247 lb 6 oz (112.2 kg)   BMI 33.55 kg/m  General: Well developed 55 year old male in no  acute distress. Head: Normocephalic and atraumatic. Eyes: No scleral icterus. Conjunctiva pink . Ears: Normal auditory acuity. Mouth: Dentition intact. No ulcers or lesions.  Lungs: Clear throughout to auscultation. Heart: Regular rate and rhythm, no murmur. Abdomen: Soft and nondistended.  Mild central lower abdominal tenderness without rebound or guarding.  No masses or hepatomegaly. Normal bowel sounds x 4 quadrants.  Rectal: Deferred, patient elected to proceed with a colonoscopy for further evaluation. Musculoskeletal: Symmetrical with no gross deformities. Extremities: No edema. Neurological: Alert oriented x 4. No focal deficits.  Psychological: Alert and cooperative. Normal mood and affect  Assessment and Recommendations:  45.  55 year old male with central lower abdominal pain which started 2 months ago which is progressively worsened and is somewhat positional. -CBC, CMP and CRP -Abdominal/pelvic CT with oral contrast only (Note patient has atrophic right kidney, IV contrast not ordered to reduce any risk of renal injury).  -Patient to call our office if his abdominal pain worsens.  2.  Rectal bleeding, intermittent.  Most likely hemorrhoidal. -Patient to schedule colonoscopy after the above CT and lab results viewed. Colonoscopy benefits and risks discussed including risk with sedation, risk of bleeding, perforation and infection   2.  History of sessile serrated/adenomatous colon polyps per colonoscopy 11/2015. -See plan in # 2  3.  Constipation. -Benefiber 1 tablespoon daily.  MiraLAX 1 capful mixed in 8 ounces of water nightly tolerated.

## 2020-04-28 ENCOUNTER — Other Ambulatory Visit (INDEPENDENT_AMBULATORY_CARE_PROVIDER_SITE_OTHER): Payer: Managed Care, Other (non HMO)

## 2020-04-28 ENCOUNTER — Encounter: Payer: Self-pay | Admitting: Nurse Practitioner

## 2020-04-28 ENCOUNTER — Other Ambulatory Visit: Payer: Self-pay

## 2020-04-28 ENCOUNTER — Ambulatory Visit (INDEPENDENT_AMBULATORY_CARE_PROVIDER_SITE_OTHER): Payer: Managed Care, Other (non HMO) | Admitting: Nurse Practitioner

## 2020-04-28 VITALS — BP 138/102 | HR 80 | Ht 72.0 in | Wt 247.4 lb

## 2020-04-28 DIAGNOSIS — K5900 Constipation, unspecified: Secondary | ICD-10-CM

## 2020-04-28 DIAGNOSIS — K625 Hemorrhage of anus and rectum: Secondary | ICD-10-CM

## 2020-04-28 DIAGNOSIS — R1030 Lower abdominal pain, unspecified: Secondary | ICD-10-CM

## 2020-04-28 LAB — CBC WITH DIFFERENTIAL/PLATELET
Basophils Absolute: 0 10*3/uL (ref 0.0–0.1)
Basophils Relative: 0.6 % (ref 0.0–3.0)
Eosinophils Absolute: 0.1 10*3/uL (ref 0.0–0.7)
Eosinophils Relative: 1.6 % (ref 0.0–5.0)
HCT: 47.5 % (ref 39.0–52.0)
Hemoglobin: 16.3 g/dL (ref 13.0–17.0)
Lymphocytes Relative: 37.8 % (ref 12.0–46.0)
Lymphs Abs: 3.2 10*3/uL (ref 0.7–4.0)
MCHC: 34.4 g/dL (ref 30.0–36.0)
MCV: 87.4 fl (ref 78.0–100.0)
Monocytes Absolute: 0.8 10*3/uL (ref 0.1–1.0)
Monocytes Relative: 9.4 % (ref 3.0–12.0)
Neutro Abs: 4.2 10*3/uL (ref 1.4–7.7)
Neutrophils Relative %: 50.6 % (ref 43.0–77.0)
Platelets: 275 10*3/uL (ref 150.0–400.0)
RBC: 5.44 Mil/uL (ref 4.22–5.81)
RDW: 13.7 % (ref 11.5–15.5)
WBC: 8.4 10*3/uL (ref 4.0–10.5)

## 2020-04-28 LAB — COMPREHENSIVE METABOLIC PANEL
ALT: 15 U/L (ref 0–53)
AST: 17 U/L (ref 0–37)
Albumin: 4.5 g/dL (ref 3.5–5.2)
Alkaline Phosphatase: 55 U/L (ref 39–117)
BUN: 10 mg/dL (ref 6–23)
CO2: 28 mEq/L (ref 19–32)
Calcium: 9.3 mg/dL (ref 8.4–10.5)
Chloride: 103 mEq/L (ref 96–112)
Creatinine, Ser: 0.87 mg/dL (ref 0.40–1.50)
GFR: 96.72 mL/min (ref >60.00)
Glucose, Bld: 94 mg/dL (ref 70–99)
Potassium: 3.9 mEq/L (ref 3.5–5.1)
Sodium: 138 mEq/L (ref 135–145)
Total Bilirubin: 1.1 mg/dL (ref 0.2–1.2)
Total Protein: 7.4 g/dL (ref 6.0–8.3)

## 2020-04-28 LAB — C-REACTIVE PROTEIN: CRP: <1 mg/dL (ref 0.5–20.0)

## 2020-04-28 NOTE — Patient Instructions (Addendum)
LABS:   Your provider has requested that you go to the basement level for lab work before leaving today. Press "B" on the elevator. The lab is located at the first door on the left as you exit the elevator.  HEALTHCARE LAWS AND MY CHART RESULTS: Due to recent changes in healthcare laws, you may see the results of your imaging and laboratory studies on MyChart before your provider has had a chance to review them.   We understand that in some cases there may be results that are confusing or concerning to you. Not all laboratory results come back in the same time frame and the provider may be waiting for multiple results in order to interpret others.  Please give [place] 48 hours in order for your provider to thoroughly review all the results before contacting the office for clarification of your results.   You have been scheduled for a CT scan of the abdomen and pelvis at [facility] are scheduled on 05/01/20 at 1:00pm. You should arrive 15 minutes prior to your appointment time for registration. Please follow the written instructions below on the day of your exam:  WARNING: IF YOU ARE ALLERGIC TO IODINE/X-RAY DYE, PLEASE NOTIFY RADIOLOGY IMMEDIATELY AT [phone]! YOU WILL BE GIVEN A 13 HOUR PREMEDICATION PREP.  1) Do not eat or drink anything after 9:00am (4 hours prior to your test) 2) You have been given 2 bottles of oral contrast to drink. The solution may taste better if refrigerated, but do NOT add ice or any other liquid to this solution. Shake well before drinking.    Drink 1 bottle of contrast @ 11:00am (2 hours prior to your exam)  Drink 1 bottle of contrast @ 12:00pm (1 hour prior to your exam)  You may take any medications as prescribed with a small amount of water, if necessary. If you take any of the following medications: METFORMIN, GLUCOPHAGE, GLUCOVANCE, AVANDAMET, RIOMET, FORTAMET, [place] MET, JANUMET, GLUMETZA or METAGLIP, you MAY be asked to HOLD this medication 48  hours AFTER the exam.  The purpose of you drinking the oral contrast is to aid in the visualization of your intestinal tract. The contrast solution may cause some diarrhea. Depending on your individual set of symptoms, you may also receive an intravenous injection of x-ray contrast/dye. Plan on being at [facility] for 30 minutes or longer, depending on the type of exam you are having performed.  This test typically takes 30-45 minutes to complete.  _______________________________________________________________________  OVER THE COUNTER MEDICATION  Please purchase the following medications over the counter and take as directed:  Miralax. Dissolve one capful in 8 ounces of water and drink before bed.  Further recommendations to be determined after your lab and CT results have come in.   It was great seeing you today!  Thank you for entrusting me with your care and choosing [facility].  Noralyn Pick, CRNP

## 2020-04-28 NOTE — Progress Notes (Signed)
Assessment and plan reviewed 

## 2020-04-30 ENCOUNTER — Telehealth: Payer: Self-pay

## 2020-04-30 NOTE — Telephone Encounter (Signed)
Patient was scheduled for CT at [facility], however insurance will only allow him to have it done at [place].  Patient is scheduled with them on 05/14/20 at 3:00 PM Patient needs to drink first bottle of contrast at 1:00 pm and the second at 2:00 pm.  I called patient and left him a voicemail to return our call for this information.

## 2020-05-01 ENCOUNTER — Ambulatory Visit (HOSPITAL_COMMUNITY): Payer: Managed Care, Other (non HMO)

## 2020-05-01 NOTE — Telephone Encounter (Signed)
Patient returned call, he did not listen to his VM so he drank the contrast.   I notified him of new CT appointment time and location, he will come by to pick up more contrast.

## 2020-05-14 ENCOUNTER — Ambulatory Visit
Admission: RE | Admit: 2020-05-14 | Discharge: 2020-05-14 | Disposition: A | Discharge status: Home / Self Care | Payer: Managed Care, Other (non HMO) | Source: Ambulatory Visit | Attending: Nurse Practitioner | Admitting: Nurse Practitioner

## 2020-05-14 ENCOUNTER — Other Ambulatory Visit: Payer: Self-pay

## 2020-05-14 DIAGNOSIS — K625 Hemorrhage of anus and rectum: Secondary | ICD-10-CM

## 2020-05-14 DIAGNOSIS — R1030 Lower abdominal pain, unspecified: Secondary | ICD-10-CM

## 2020-05-14 DIAGNOSIS — K5900 Constipation, unspecified: Secondary | ICD-10-CM

## 2020-05-19 ENCOUNTER — Telehealth: Payer: Self-pay | Admitting: Nurse Practitioner

## 2020-05-19 NOTE — Telephone Encounter (Signed)
Pt is requesting a call back from a nurse regarding his CT scan results.

## 2020-05-19 NOTE — Telephone Encounter (Signed)
See result note.  

## 2020-06-10 ENCOUNTER — Other Ambulatory Visit: Payer: Self-pay

## 2020-06-10 ENCOUNTER — Telehealth: Payer: Self-pay | Admitting: *Deleted

## 2020-06-10 ENCOUNTER — Ambulatory Visit (AMBULATORY_SURGERY_CENTER): Payer: Self-pay | Admitting: *Deleted

## 2020-06-10 VITALS — Ht 73.0 in | Wt 250.0 lb

## 2020-06-10 DIAGNOSIS — R1030 Lower abdominal pain, unspecified: Secondary | ICD-10-CM

## 2020-06-10 MED ORDER — NA SULFATE-K SULFATE-MG SULF 17.5-3.13-1.6 GM/177ML PO SOLN: 1.0000 | Freq: Once | ORAL | 0 refills | Status: AC

## 2020-06-10 NOTE — Telephone Encounter (Signed)
Virtual pre visit completed.  

## 2020-06-10 NOTE — Progress Notes (Addendum)
No egg or soy allergy known to patient  No issues with past sedation with any surgeries or procedures No intubation problems in the past  No FH of Malignant Hyperthermia No diet pills per patient No home 02 use per patient  No blood thinners per patient  Pt denies issues with constipation  No A fib or A flutter   Virtual pre-visit completed. Instructions mailed to patient with Suprep coupon.  Due to the COVID-19 pandemic we are asking patients to follow certain guidelines.  Pt aware of COVID protocols and [facility] guidelines

## 2020-07-01 ENCOUNTER — Encounter: Payer: Self-pay | Admitting: Internal Medicine

## 2020-07-02 ENCOUNTER — Ambulatory Visit (AMBULATORY_SURGERY_CENTER): Payer: Managed Care, Other (non HMO) | Admitting: Internal Medicine

## 2020-07-02 ENCOUNTER — Encounter: Payer: Self-pay | Admitting: Internal Medicine

## 2020-07-02 ENCOUNTER — Other Ambulatory Visit: Payer: Self-pay

## 2020-07-02 VITALS — BP 115/73 | HR 70 | Temp 97.1°F | Resp 15 | Ht 72.0 in | Wt 250.0 lb

## 2020-07-02 DIAGNOSIS — D122 Benign neoplasm of ascending colon: Secondary | ICD-10-CM

## 2020-07-02 DIAGNOSIS — K621 Rectal polyp: Secondary | ICD-10-CM

## 2020-07-02 DIAGNOSIS — R1030 Lower abdominal pain, unspecified: Secondary | ICD-10-CM | POA: Diagnosis not present

## 2020-07-02 DIAGNOSIS — D120 Benign neoplasm of cecum: Secondary | ICD-10-CM

## 2020-07-02 DIAGNOSIS — K625 Hemorrhage of anus and rectum: Secondary | ICD-10-CM

## 2020-07-02 DIAGNOSIS — K6389 Other specified diseases of intestine: Secondary | ICD-10-CM

## 2020-07-02 DIAGNOSIS — D128 Benign neoplasm of rectum: Secondary | ICD-10-CM

## 2020-07-02 HISTORY — PX: COLONOSCOPY: SHX174

## 2020-07-02 MED ORDER — SODIUM CHLORIDE 0.9 % IV SOLN: 500.0000 mL | Freq: Once | INTRAVENOUS | Status: DC

## 2020-07-02 NOTE — Progress Notes (Signed)
Called to room to assist during endoscopic procedure.  Patient ID and intended procedure confirmed with present staff. Received instructions for my participation in the procedure from the performing physician.  

## 2020-07-02 NOTE — Progress Notes (Signed)
pt tolerated well. VSS. awake and to recovery. Report given to RN.  

## 2020-07-02 NOTE — Op Note (Signed)
[facility] Patient Name: Daniel Stone Procedure Date: 07/02/2020 2:16 PM MRN: [id number] Endoscopist: Docia Chuck. Henrene Pastor , MD Age: 55 Referring MD:  Date of Birth: 1965/10/19 Gender: Male Account #: [id number] Procedure:                Colonoscopy with cold snare polypectomy x 5; with                            biopsies Indications:              High risk colon cancer surveillance: Personal                            history of sessile serrated colon polyp (less than                            10 mm in size) with no dysplasia. Previous                            examination 2017. Recent problems with                            musculoskeletal lower abdominal pain (negative CT)                            and minor rectal bleeding (has stopped) Medicines:                Monitored Anesthesia Care Procedure:                Pre-Anesthesia Assessment:                           - Prior to the procedure, a History and Physical                            was performed, and patient medications and                            allergies were reviewed. The patient's tolerance of                            previous anesthesia was also reviewed. The risks                            and benefits of the procedure and the sedation                            options and risks were discussed with the patient.                            All questions were answered, and informed consent                            was obtained. Prior Anticoagulants: The patient has  taken no previous anticoagulant or antiplatelet                            agents. ASA Grade Assessment: II - A patient with                            mild systemic disease. After reviewing the risks                            and benefits, the patient was deemed in                            satisfactory condition to undergo the procedure.                           After obtaining informed consent, the  colonoscope                            was passed under direct vision. Throughout the                            procedure, the patient's blood pressure, pulse, and                            oxygen saturations were monitored continuously. The                            Olympus CF-HQ190L (Serial# [id number]) Colonoscope was                            introduced through the anus and advanced to the the                            cecum, identified by appendiceal orifice and                            ileocecal valve. The ileocecal valve, appendiceal                            orifice, and rectum were photographed. The quality                            of the bowel preparation was excellent. The                            colonoscopy was performed without difficulty. The                            patient tolerated the procedure well. The bowel                            preparation used was SUPREP via split dose  instruction. Scope In: 2:39:43 PM Scope Out: 2:57:05 PM Scope Withdrawal Time: 0 hours 15 minutes 36 seconds  Total Procedure Duration: 0 hours 17 minutes 22 seconds  Findings:                 Five polyps were found in the rectum, ascending                            colon and cecum. The polyps were 1 to 2 mm in size.                            These polyps were removed with a cold snare.                            Resection and retrieval were complete.                           Diverticula were found in the sigmoid colon.                           The exam was otherwise without abnormality on                            direct and retroflexion views. Complications:            No immediate complications. Estimated blood loss:                            None. Estimated Blood Loss:     Estimated blood loss: none. Impression:               - Five 1 to 2 mm polyps in the rectum, in the                            ascending colon and in the cecum, removed with a                             cold snare. Resected and retrieved.                           - Diverticulosis in the sigmoid colon.                           - The examination was otherwise normal on direct                            and retroflexion views. Recommendation:           - Repeat colonoscopy in 5 years for surveillance.                           - Patient has a contact number available for                            emergencies. The signs and symptoms of potential  delayed complications were discussed with the                            patient. Return to normal activities tomorrow.                            Written discharge instructions were provided to the                            patient.                           - Resume previous diet.                           - Continue present medications.                           - Await pathology results. Docia Chuck. Henrene Pastor, MD 07/02/2020 3:04:26 PM This report has been signed electronically.

## 2020-07-02 NOTE — Patient Instructions (Signed)
Please read handouts provided. Continue present medications. Await pathology results. Repeat colonoscopy in 5 years.     YOU HAD AN ENDOSCOPIC PROCEDURE TODAY AT [facility]:   Refer to the procedure report that was given to you for any specific questions about what was found during the examination.  If the procedure report does not answer your questions, please call your gastroenterologist to clarify.  If you requested that your care partner not be given the details of your procedure findings, then the procedure report has been included in a sealed envelope for you to review at your convenience later.  YOU SHOULD EXPECT: Some feelings of bloating in the abdomen. Passage of more gas than usual.  Walking can help get rid of the air that was put into your GI tract during the procedure and reduce the bloating. If you had a lower endoscopy (such as a colonoscopy or flexible sigmoidoscopy) you may notice spotting of blood in your stool or on the toilet paper. If you underwent a bowel prep for your procedure, you may not have a normal bowel movement for a few days.  Please Note:  You might notice some irritation and congestion in your nose or some drainage.  This is from the oxygen used during your procedure.  There is no need for concern and it should clear up in a day or so.  SYMPTOMS TO REPORT IMMEDIATELY:   Following lower endoscopy (colonoscopy or flexible sigmoidoscopy):  Excessive amounts of blood in the stool  Significant tenderness or worsening of abdominal pains  Swelling of the abdomen that is new, acute  Fever of 100F or higher   For urgent or emergent issues, a gastroenterologist can be reached at any hour by calling [phone]. Do not use MyChart messaging for urgent concerns.    DIET:  We do recommend a small meal at first, but then you may proceed to your regular diet.  Drink plenty of fluids but you should avoid alcoholic beverages for 24  hours.  ACTIVITY:  You should plan to take it easy for the rest of today and you should NOT DRIVE or use heavy machinery until tomorrow (because of the sedation medicines used during the test).    FOLLOW UP: Our staff will call the number listed on your records 48-72 hours following your procedure to check on you and address any questions or concerns that you may have regarding the information given to you following your procedure. If we do not reach you, we will leave a message.  We will attempt to reach you two times.  During this call, we will ask if you have developed any symptoms of COVID 19. If you develop any symptoms (ie: fever, flu-like symptoms, shortness of breath, cough etc.) before then, please call [phone].  If you test positive for Covid 19 in the 2 weeks post procedure, please call and report this information to [place].    If any biopsies were taken you will be contacted by phone or by letter within the next 1-3 weeks.  Please call us at [phone] if you have not heard about the biopsies in 3 weeks.    SIGNATURES/CONFIDENTIALITY: You and/or your care partner have signed paperwork which will be entered into your electronic medical record.  These signatures attest to the fact that that the information above on your After Visit Summary has been reviewed and is understood.  Full responsibility of the confidentiality of this discharge information lies with you and/or your  care-partner.

## 2020-07-02 NOTE — Progress Notes (Signed)
Vitals by NS. Pt's states no medical or surgical changes since previsit or office visit.

## 2020-07-04 ENCOUNTER — Telehealth: Payer: Self-pay | Admitting: *Deleted

## 2020-07-04 NOTE — Telephone Encounter (Signed)
  Follow up Call-  Call back number 07/02/2020  Post procedure Call Back phone  # [phone]  Permission to leave phone message Yes  Some recent data might be hidden     Patient questions:  Do you have a fever, pain , or abdominal swelling? No. Pain Score  0 *  Have you tolerated food without any problems? Yes.    Have you been able to return to your normal activities? Yes.    Do you have any questions about your discharge instructions: Diet   No. Medications  No. Follow up visit  No.  Do you have questions or concerns about your Care? No.  Actions: * If pain score is 4 or above: No action needed, pain <4

## 2020-07-09 ENCOUNTER — Encounter: Payer: Self-pay | Admitting: Internal Medicine

## 2022-06-06 IMAGING — CT CT ABD-PELV W/O CM
1 of 2 series · 13 of 32 positions shown, 18 images · non-contrast
Comparison: CT the abdomen and pelvis 04/25/2012.

CLINICAL DATA: 54-year-old male with history of rectal bleeding.
Constipation. Lower abdominal pain.

EXAM:
CT ABDOMEN AND PELVIS WITHOUT CONTRAST
TECHNIQUE: Multidetector CT imaging of the abdomen and pelvis was performed
following the standard protocol without IV contrast.

[Series 2: abd/pelvis w/(date) · axial · 0.87mm/px · z∈[-499,-24]mm · 13 of 107 slices shown, 18 images]
[im 6/107  soft-tissue]
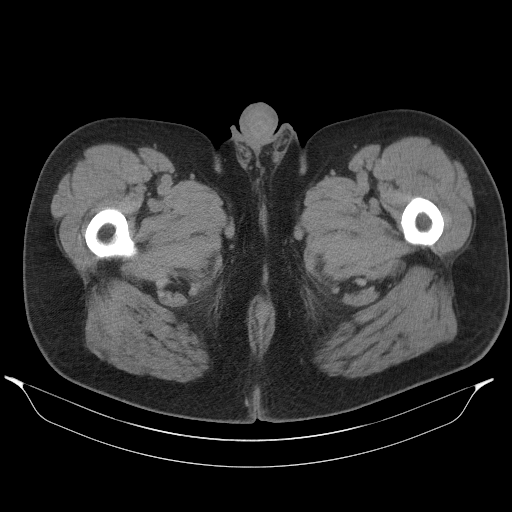
[im 6/107  bone]
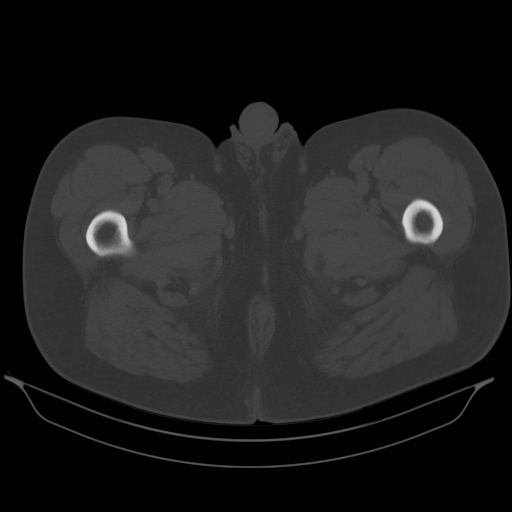
[im 18/107  soft-tissue]
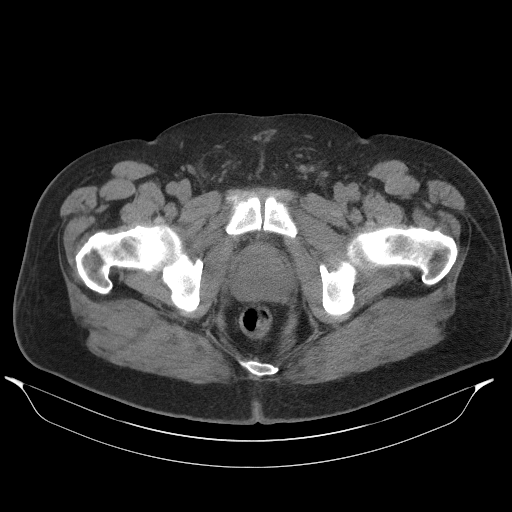
[im 24/107  soft-tissue]
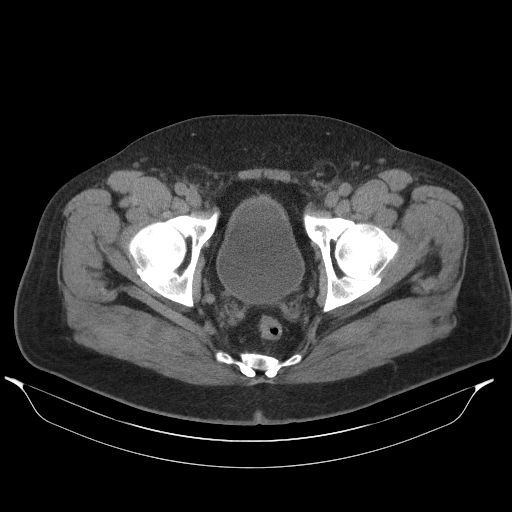
[im 30/107  soft-tissue]
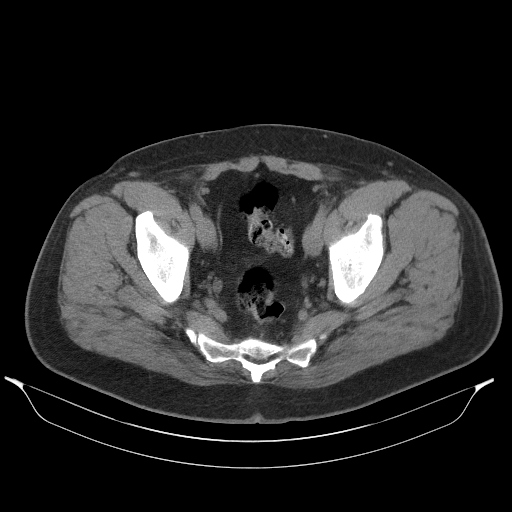
[im 42/107  soft-tissue]
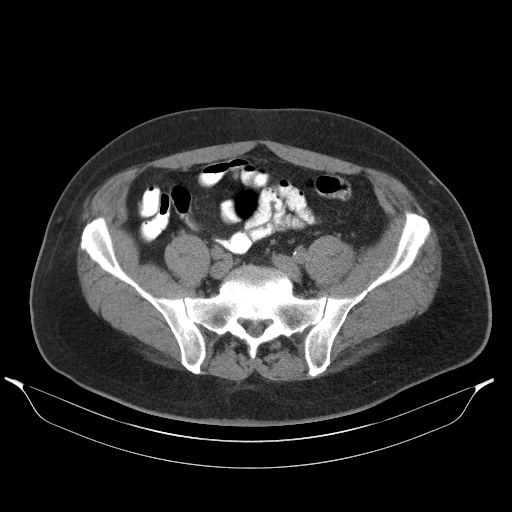
[im 48/107  soft-tissue]
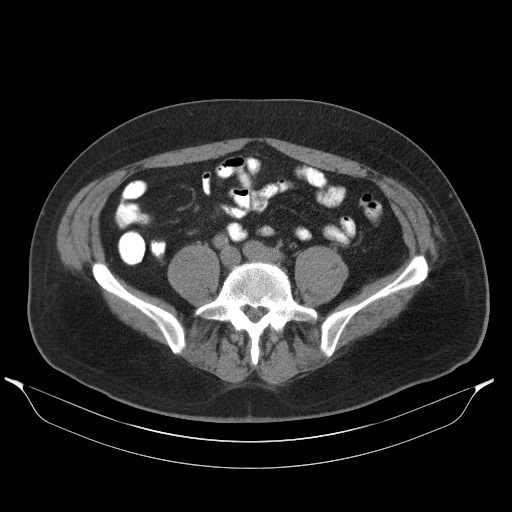
[im 59/107  soft-tissue]
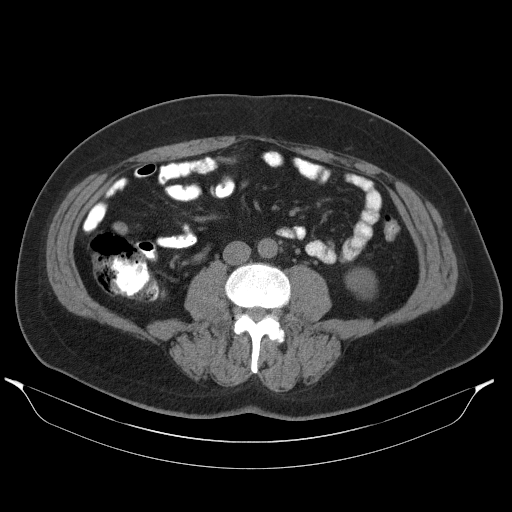
[im 65/107  soft-tissue]
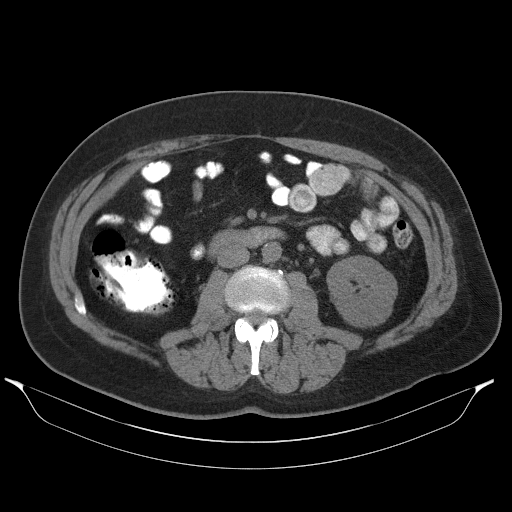
[im 77/107  soft-tissue]
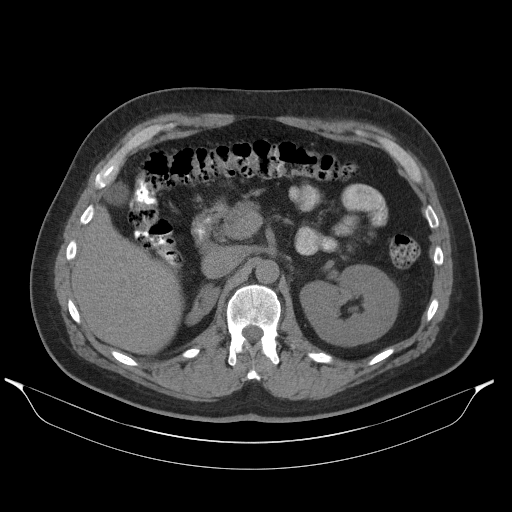
[im 77/107  bone]
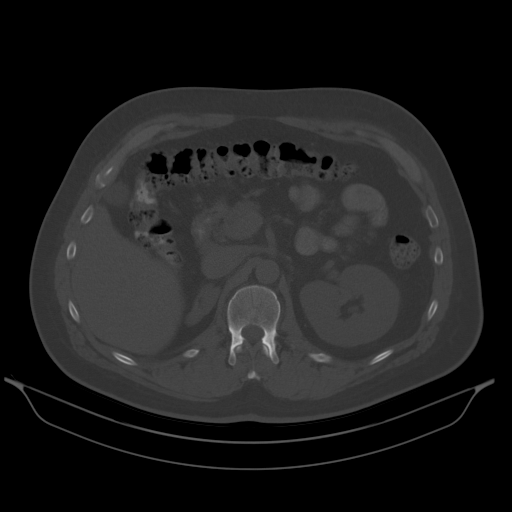
[im 83/107  soft-tissue]
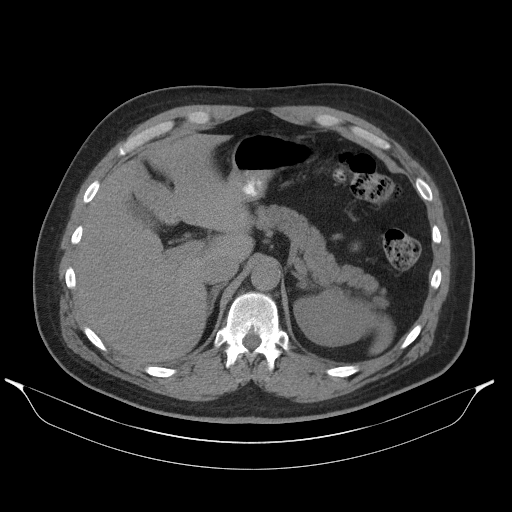
[im 83/107  lung]
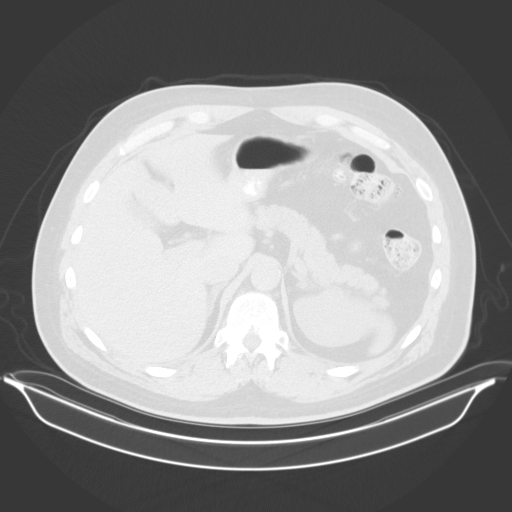
[im 89/107  soft-tissue]
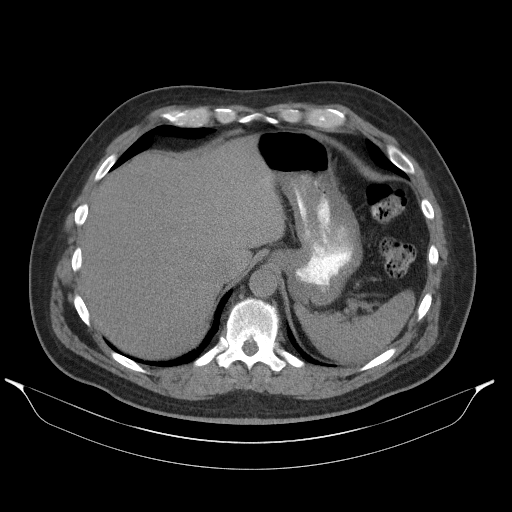
[im 89/107  lung]
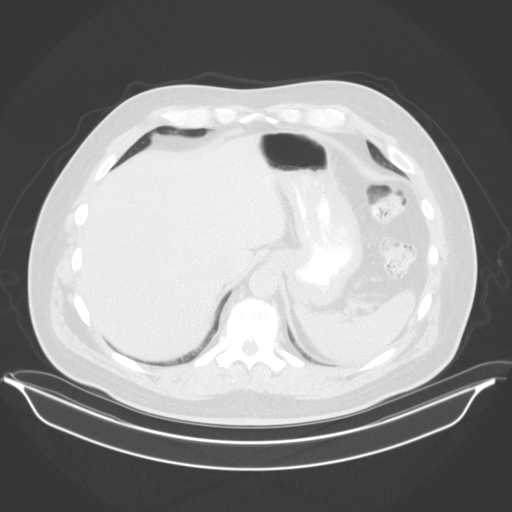
[im 95/107  lung]
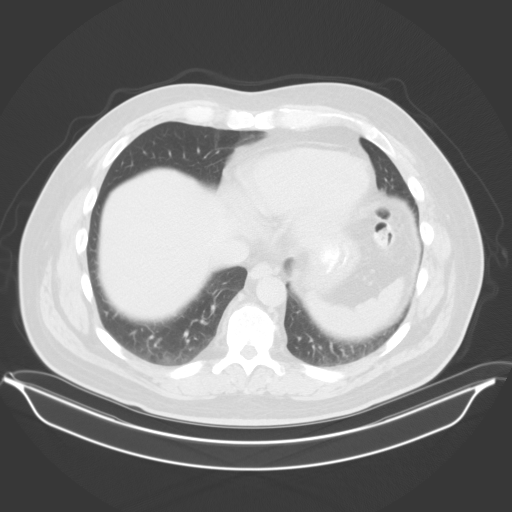
[im 101/107  soft-tissue]
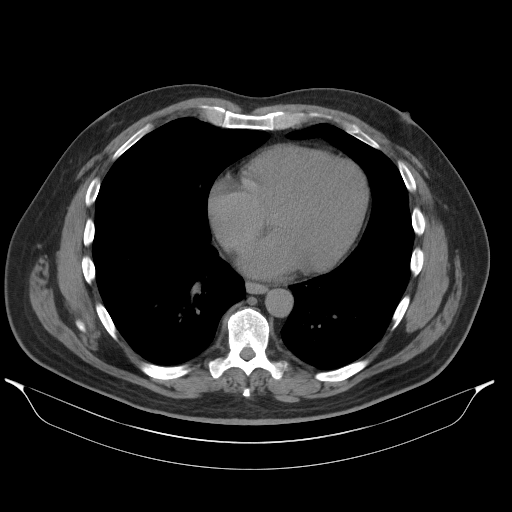
[im 101/107  lung]
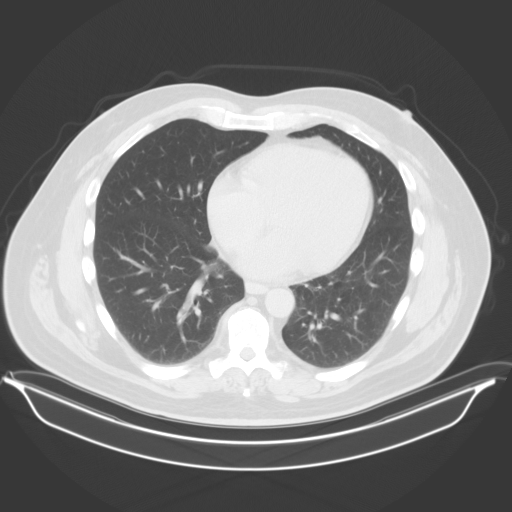

[13 of 32 positions shown; findings below may reference images not displayed]

FINDINGS: Lower chest: Aortic atherosclerosis.

Hepatobiliary: Diffuse low attenuation throughout the hepatic
parenchyma, indicative of a background of mild hepatic steatosis. No
definite suspicious cystic or solid hepatic lesions are confidently
identified on today's noncontrast CT examination. The unenhanced
appearance of the gallbladder is normal.

Pancreas: No definite pancreatic mass or peripancreatic fluid
collections or inflammatory changes are noted on today's noncontrast
CT examination.

Spleen: Unremarkable.

Adrenals/Urinary Tract: Severe atrophy of the right kidney. Small
nonobstructive calculi are noted within the right renal collecting
system measuring up to 3 mm in the lower pole. No additional calculi
are noted within the left renal collecting system, along the course
of either ureter, or within the lumen of the urinary bladder. No
hydroureteronephrosis. Unenhanced appearance of the left kidney is
normal. Urinary bladder is normal in appearance. Bilateral adrenal
glands are normal in appearance.

Stomach/Bowel: Unenhanced appearance of the stomach is normal. There
is no pathologic dilatation of small bowel or colon. Normal
appendix.

Vascular/Lymphatic: Aortic atherosclerosis. No lymphadenopathy noted
in the abdomen or pelvis.

Reproductive: Mild median lobe hypertrophy of the prostate gland.
Seminal vesicles are unremarkable in appearance.

Other: No significant volume of ascites.  No pneumoperitoneum.

Musculoskeletal: There are no aggressive appearing lytic or blastic
lesions noted in the visualized portions of the skeleton.
IMPRESSION: 1. No acute findings are noted in the abdomen or pelvis to account
for the patient's symptoms.
2. Severe atrophy of the right kidney with small nonobstructive
calculi in the right renal collecting system measuring up to 3 mm in
the lower pole. No ureteral stones or findings of urinary tract
obstruction are noted at this time.
3. Aortic atherosclerosis.
4. Hepatic steatosis.

## 2022-07-19 ENCOUNTER — Other Ambulatory Visit: Payer: Self-pay | Admitting: Urology

## 2022-07-19 DIAGNOSIS — R972 Elevated prostate specific antigen [PSA]: Secondary | ICD-10-CM

## 2022-09-01 ENCOUNTER — Ambulatory Visit
Admission: RE | Admit: 2022-09-01 | Discharge: 2022-09-01 | Disposition: A | Discharge status: Home / Self Care | Payer: 59 | Source: Ambulatory Visit | Attending: Urology | Admitting: Urology

## 2022-09-01 DIAGNOSIS — R972 Elevated prostate specific antigen [PSA]: Secondary | ICD-10-CM

## 2022-09-01 MED ORDER — GADOPICLENOL 0.5 MMOL/ML IV SOLN
10.0000 mL | Freq: Once | INTRAVENOUS | Status: AC | PRN
  Administered 2022-09-01: 10 mL via INTRAVENOUS

## 2022-10-06 ENCOUNTER — Telehealth: Payer: Self-pay | Admitting: Radiation Oncology

## 2022-10-06 NOTE — Telephone Encounter (Signed)
Left message for patient to call back to schedule consult per 7/9 referral. 

## 2022-10-07 ENCOUNTER — Telehealth: Payer: Self-pay | Admitting: Radiation Oncology

## 2022-10-07 NOTE — Telephone Encounter (Signed)
Left message for patient to call back to schedule consult per 7/9 referral.

## 2022-10-12 ENCOUNTER — Other Ambulatory Visit: Payer: Self-pay | Admitting: Urology

## 2022-11-09 NOTE — Patient Instructions (Signed)
DUE TO COVID-19 ONLY TWO VISITORS  (aged 59 and older)  ARE ALLOWED TO COME WITH YOU AND STAY IN THE WAITING ROOM ONLY DURING PRE OP AND PROCEDURE.   **NO VISITORS ARE ALLOWED IN THE SHORT STAY AREA OR RECOVERY ROOM!!**  IF YOU WILL BE ADMITTED INTO THE HOSPITAL YOU ARE ALLOWED ONLY FOUR SUPPORT PEOPLE DURING VISITATION HOURS ONLY (7 AM -8PM)   The support person(s) must pass our screening, gel in and out, and wear a mask at all times, including in the patient's room. Patients must also wear a mask when staff or their support person are in the room. Visitors GUEST BADGE MUST BE WORN VISIBLY  One adult visitor may remain with you overnight and MUST be in the room by 8 P.M.     Your procedure is scheduled on: 11/19/23   Report to [facility] Main Entrance    Report to admitting at : 5:15 AM   Call this number if you have problems the morning of surgery [phone]   Clear liquids starting the day before surgery until : 4:30 AM DAY OF SURGERY. Drink plenty clear liquids the day before surgery.  Water Black Coffee (sugar ok, NO MILK/CREAM OR CREAMERS)  Tea (sugar ok, NO MILK/CREAM OR CREAMERS) regular and decaf                             Plain Jell-O (NO RED)                                           Fruit ices (not with fruit pulp, NO RED)                                     Popsicles (NO RED)                                                                  Juice: apple, WHITE grape, WHITE cranberry Sports drinks like Gatorade (NO RED)             FOLLOW BOWEL PREP AND ANY ADDITIONAL PRE OP INSTRUCTIONS YOU RECEIVED FROM YOUR SURGEON'S OFFICE!!!   Oral Hygiene is also important to reduce your risk of infection.                                    Remember - BRUSH YOUR TEETH THE MORNING OF SURGERY WITH YOUR REGULAR TOOTHPASTE  DENTURES WILL BE REMOVED PRIOR TO SURGERY PLEASE DO NOT APPLY "Poly grip" OR ADHESIVES!!!   Do NOT smoke after Midnight   Take these medicines the  morning of surgery with A SIP OF WATER: N/A  DO NOT TAKE ANY ORAL DIABETIC MEDICATIONS DAY OF YOUR SURGERY  Bring CPAP mask and tubing day of surgery.                              You may not have  any metal on your body including hair pins, jewelry, and body piercing             Do not wear lotions, powders, perfumes/cologne, or deodorant              Men may shave face and neck.   Do not bring valuables to the hospital. [facility] IS NOT             RESPONSIBLE   FOR VALUABLES.   Contacts, glasses, or bridgework may not be worn into surgery.   Bring small overnight bag day of surgery.   DO NOT BRING YOUR HOME MEDICATIONS TO THE HOSPITAL. PHARMACY WILL DISPENSE MEDICATIONS LISTED ON YOUR MEDICATION LIST TO YOU DURING YOUR ADMISSION IN THE HOSPITAL!    Patients discharged on the day of surgery will not be allowed to drive home.  Someone NEEDS to stay with you for the first 24 hours after anesthesia.   Special Instructions: Bring a copy of your healthcare power of attorney and living will documents         the day of surgery if you haven't scanned them before.              Please read over the following fact sheets you were given: IF YOU HAVE QUESTIONS ABOUT YOUR PRE-OP INSTRUCTIONS PLEASE CALL [phone]    [facility] - Preparing for Surgery Before surgery, you can play an important role.  Because skin is not sterile, your skin needs to be as free of germs as possible.  You can reduce the number of germs on your skin by washing with CHG (chlorahexidine gluconate) soap before surgery.  CHG is an antiseptic cleaner which kills germs and bonds with the skin to continue killing germs even after washing. Please DO NOT use if you have an allergy to CHG or antibacterial soaps.  If your skin becomes reddened/irritated stop using the CHG and inform your nurse when you arrive at Short Stay. Do not shave (including legs and underarms) for at least 48 hours prior to the first CHG shower.  You  may shave your face/neck. Please follow these instructions carefully:  1.  Shower with CHG Soap the night before surgery and the  morning of Surgery.  2.  If you choose to wash your hair, wash your hair first as usual with your  normal  shampoo.  3.  After you shampoo, rinse your hair and body thoroughly to remove the  shampoo.                           4.  Use CHG as you would any other liquid soap.  You can apply chg directly  to the skin and wash                       Gently with a scrungie or clean washcloth.  5.  Apply the CHG Soap to your body ONLY FROM THE NECK DOWN.   Do not use on face/ open                           Wound or open sores. Avoid contact with eyes, ears mouth and genitals (private parts).                       Wash face,  Genitals (private parts) with your normal soap.  6.  Wash thoroughly, paying special attention to the area where your surgery  will be performed.  7.  Thoroughly rinse your body with warm water from the neck down.  8.  DO NOT shower/wash with your normal soap after using and rinsing off  the CHG Soap.                9.  Pat yourself dry with a clean towel.            10.  Wear clean pajamas.            11.  Place clean sheets on your bed the night of your first shower and do not  sleep with pets. Day of Surgery : Do not apply any lotions/deodorants the morning of surgery.  Please wear clean clothes to the hospital/surgery center.  FAILURE TO FOLLOW THESE INSTRUCTIONS MAY RESULT IN THE CANCELLATION OF YOUR SURGERY PATIENT SIGNATURE_________________________________  NURSE SIGNATURE__________________________________  ________________________________________________________________________

## 2022-11-11 ENCOUNTER — Encounter (HOSPITAL_COMMUNITY): Payer: Self-pay

## 2022-11-11 ENCOUNTER — Encounter (HOSPITAL_COMMUNITY)
Admission: RE | Admit: 2022-11-11 | Discharge: 2022-11-11 | Disposition: A | Discharge status: Home / Self Care | Payer: 59 | Source: Ambulatory Visit | Attending: Urology | Admitting: Urology

## 2022-11-11 ENCOUNTER — Other Ambulatory Visit: Payer: Self-pay

## 2022-11-11 VITALS — BP 120/90 | HR 72 | Temp 97.9°F | Ht 73.0 in | Wt 245.0 lb

## 2022-11-11 DIAGNOSIS — I10 Essential (primary) hypertension: Secondary | ICD-10-CM | POA: Diagnosis not present

## 2022-11-11 DIAGNOSIS — Z01818 Encounter for other preprocedural examination: Secondary | ICD-10-CM | POA: Insufficient documentation

## 2022-11-11 HISTORY — DX: Pneumonia, unspecified organism: J18.9

## 2022-11-11 HISTORY — DX: Local infection of the skin and subcutaneous tissue, unspecified: B95.8

## 2022-11-11 HISTORY — DX: Local infection of the skin and subcutaneous tissue, unspecified: L08.9

## 2022-11-11 LAB — BASIC METABOLIC PANEL
Anion gap: 12 (ref 5–15)
BUN: 10 mg/dL (ref 6–20)
CO2: 24 mmol/L (ref 22–32)
Calcium: 9.1 mg/dL (ref 8.9–10.3)
Chloride: 101 mmol/L (ref 98–111)
Creatinine, Ser: 0.86 mg/dL (ref 0.61–1.24)
GFR, Estimated: >60 mL/min (ref >60)
Glucose, Bld: 89 mg/dL (ref 70–99)
Potassium: 3.6 mmol/L (ref 3.5–5.1)
Sodium: 137 mmol/L (ref 135–145)

## 2022-11-11 LAB — CBC
HCT: 45.4 % (ref 39.0–52.0)
Hemoglobin: 15 g/dL (ref 13.0–17.0)
MCH: 29.4 pg (ref 26.0–34.0)
MCHC: 33 g/dL (ref 30.0–36.0)
MCV: 89 fL (ref 80.0–100.0)
Platelets: 249 10*3/uL (ref 150–400)
RBC: 5.1 MIL/uL (ref 4.22–5.81)
RDW: 12.9 % (ref 11.5–15.5)
WBC: 10.6 10*3/uL — ABNORMAL HIGH (ref 4.0–10.5)
nRBC: 0 % (ref 0.0–0.2)

## 2022-11-11 NOTE — Progress Notes (Signed)
   11/11/22 1423  OBSTRUCTIVE SLEEP APNEA  Have you ever been diagnosed with sleep apnea through a sleep study? No  Do you snore loudly (loud enough to be heard through closed doors)?  1  Do you often feel tired, fatigued, or sleepy during the daytime (such as falling asleep during driving or talking to someone)? 1  Has anyone observed you stop breathing during your sleep? 1  Do you have, or are you being treated for high blood pressure? 1  BMI more than 35 kg/m2? 0  Age > 50 (1-yes) 1  Neck circumference greater than:Male 16 inches or larger, Male 17inches or larger? 0  Male Gender (Yes=1) 1  Obstructive Sleep Apnea Score 6  Score 5 or greater  Results sent to PCP

## 2022-11-11 NOTE — Progress Notes (Signed)
For Short Stay: COVID SWAB appointment date:  Bowel Prep reminder:   For Anesthesia: PCP - Dr. Jarome Matin Cardiologist - N/A  Chest x-ray -  EKG - 11/11/22 Stress Test -  ECHO -  Cardiac Cath -  Pacemaker/ICD device last checked: Pacemaker orders received: Device Rep notified:  Spinal Cord Stimulator: N/A  Sleep Study - N/A  CPAP -   Fasting Blood Sugar - N/A Checks Blood Sugar _____ times a day Date and result of last Hgb A1c-  Last dose of GLP1 agonist- N/A GLP1 instructions:   Last dose of SGLT-2 inhibitors- N/A SGLT-2 instructions:   Blood Thinner Instructions: N/A Aspirin Instructions: Last Dose:  Activity level: Can go up a flight of stairs and activities of daily living without stopping and without chest pain and/or shortness of breath   Able to exercise without chest pain and/or shortness of breath  Anesthesia review: Hx: HTN.  Patient denies shortness of breath, fever, cough and chest pain at PAT appointment   Patient verbalized understanding of instructions that were given to them at the PAT appointment. Patient was also instructed that they will need to review over the PAT instructions again at home before surgery.

## 2022-11-19 ENCOUNTER — Observation Stay (HOSPITAL_COMMUNITY)
Admission: RE | Admit: 2022-11-19 | Discharge: 2022-11-20 | Disposition: A | Discharge status: Home / Self Care | Payer: 59 | Attending: Urology | Admitting: Urology

## 2022-11-19 ENCOUNTER — Ambulatory Visit (HOSPITAL_BASED_OUTPATIENT_CLINIC_OR_DEPARTMENT_OTHER): Payer: 59 | Admitting: Anesthesiology

## 2022-11-19 ENCOUNTER — Other Ambulatory Visit: Payer: Self-pay

## 2022-11-19 ENCOUNTER — Encounter (HOSPITAL_COMMUNITY): Payer: Self-pay | Admitting: Urology

## 2022-11-19 ENCOUNTER — Ambulatory Visit (HOSPITAL_COMMUNITY): Payer: 59 | Admitting: Anesthesiology

## 2022-11-19 ENCOUNTER — Encounter (HOSPITAL_COMMUNITY)
Admission: RE | Disposition: A | Discharge status: Home / Self Care | Payer: Self-pay | Source: Home / Self Care | Attending: Urology

## 2022-11-19 DIAGNOSIS — Z87891 Personal history of nicotine dependence: Secondary | ICD-10-CM | POA: Insufficient documentation

## 2022-11-19 DIAGNOSIS — E785 Hyperlipidemia, unspecified: Secondary | ICD-10-CM

## 2022-11-19 DIAGNOSIS — I10 Essential (primary) hypertension: Secondary | ICD-10-CM | POA: Diagnosis not present

## 2022-11-19 DIAGNOSIS — Z9079 Acquired absence of other genital organ(s): Principal | ICD-10-CM

## 2022-11-19 DIAGNOSIS — C61 Malignant neoplasm of prostate: Secondary | ICD-10-CM

## 2022-11-19 HISTORY — PX: ROBOT ASSISTED LAPAROSCOPIC RADICAL PROSTATECTOMY: SHX5141

## 2022-11-19 HISTORY — PX: LYMPH NODE DISSECTION: SHX5087

## 2022-11-19 LAB — HEMOGLOBIN AND HEMATOCRIT, BLOOD
HCT: 46 % (ref 39.0–52.0)
Hemoglobin: 15.1 g/dL (ref 13.0–17.0)

## 2022-11-19 SURGERY — PROSTATECTOMY, RADICAL, ROBOT-ASSISTED, LAPAROSCOPIC
Anesthesia: General

## 2022-11-19 MED ORDER — SODIUM CHLORIDE (PF) 0.9 % IJ SOLN
INTRAMUSCULAR | Status: AC
  Filled 2022-11-19: qty 20

## 2022-11-19 MED ORDER — CEFAZOLIN SODIUM-DEXTROSE 1-4 GM/50ML-% IV SOLN
1.0000 g | Freq: Three times a day (TID) | INTRAVENOUS | Status: AC
  Administered 2022-11-19 (×2): 1 g via INTRAVENOUS
  Filled 2022-11-19 (×2): qty 50

## 2022-11-19 MED ORDER — PROMETHAZINE HCL 25 MG/ML IJ SOLN: 6.2500 mg | INTRAMUSCULAR | Status: DC | PRN

## 2022-11-19 MED ORDER — PROPOFOL 10 MG/ML IV BOLUS
INTRAVENOUS | Status: DC | PRN
Start: 2022-11-19 — End: 2022-11-19
  Administered 2022-11-19: 200 mg via INTRAVENOUS

## 2022-11-19 MED ORDER — HYDROMORPHONE HCL 1 MG/ML IJ SOLN
INTRAMUSCULAR | Status: DC | PRN
  Administered 2022-11-19: 1 mg via INTRAVENOUS
  Administered 2022-11-19: .5 mg via INTRAVENOUS

## 2022-11-19 MED ORDER — MIDAZOLAM HCL 5 MG/5ML IJ SOLN
INTRAMUSCULAR | Status: DC | PRN
  Administered 2022-11-19: 2 mg via INTRAVENOUS

## 2022-11-19 MED ORDER — ROCURONIUM BROMIDE 100 MG/10ML IV SOLN
INTRAVENOUS | Status: DC | PRN
  Administered 2022-11-19: 100 mg via INTRAVENOUS

## 2022-11-19 MED ORDER — FENTANYL CITRATE (PF) 250 MCG/5ML IJ SOLN
INTRAMUSCULAR | Status: AC
  Filled 2022-11-19: qty 5

## 2022-11-19 MED ORDER — KETOROLAC TROMETHAMINE 15 MG/ML IJ SOLN
15.0000 mg | Freq: Four times a day (QID) | INTRAMUSCULAR | Status: DC
  Administered 2022-11-19 – 2022-11-20 (×4): 15 mg via INTRAVENOUS
  Filled 2022-11-19 (×4): qty 1

## 2022-11-19 MED ORDER — EPHEDRINE 5 MG/ML INJ
INTRAVENOUS | Status: AC
  Filled 2022-11-19: qty 5

## 2022-11-19 MED ORDER — LACTATED RINGERS IV SOLN: INTRAVENOUS | Status: DC

## 2022-11-19 MED ORDER — LIDOCAINE HCL (CARDIAC) PF 100 MG/5ML IV SOSY
PREFILLED_SYRINGE | INTRAVENOUS | Status: DC | PRN
  Administered 2022-11-19: 60 mg via INTRAVENOUS

## 2022-11-19 MED ORDER — TRIPLE ANTIBIOTIC 3.5-400-5000 EX OINT: 1.0000 | TOPICAL_OINTMENT | Freq: Three times a day (TID) | CUTANEOUS | Status: DC | PRN

## 2022-11-19 MED ORDER — LIDOCAINE HCL (PF) 2 % IJ SOLN
INTRAMUSCULAR | Status: DC | PRN
Start: 2022-11-19 — End: 2022-11-19
  Administered 2022-11-19: 1.5 mg/kg/h via INTRADERMAL

## 2022-11-19 MED ORDER — ONDANSETRON HCL 4 MG/2ML IJ SOLN
INTRAMUSCULAR | Status: DC | PRN
Start: 2022-11-19 — End: 2022-11-19
  Administered 2022-11-19: 4 mg via INTRAVENOUS

## 2022-11-19 MED ORDER — BUPIVACAINE LIPOSOME 1.3 % IJ SUSP
INTRAMUSCULAR | Status: AC
  Filled 2022-11-19: qty 20

## 2022-11-19 MED ORDER — KETAMINE HCL 10 MG/ML IJ SOLN
INTRAMUSCULAR | Status: DC | PRN
Start: 2022-11-19 — End: 2022-11-19
  Administered 2022-11-19: 50 mg via INTRAVENOUS

## 2022-11-19 MED ORDER — SENNOSIDES-DOCUSATE SODIUM 8.6-50 MG PO TABS: 1.0000 | ORAL_TABLET | Freq: Two times a day (BID) | ORAL | 0 refills | Status: AC

## 2022-11-19 MED ORDER — AMISULPRIDE (ANTIEMETIC) 5 MG/2ML IV SOLN: 10.0000 mg | Freq: Once | INTRAVENOUS | Status: DC | PRN

## 2022-11-19 MED ORDER — LIDOCAINE HCL (PF) 2 % IJ SOLN
INTRAMUSCULAR | Status: AC
  Filled 2022-11-19: qty 5

## 2022-11-19 MED ORDER — HYDROMORPHONE HCL 2 MG/ML IJ SOLN
INTRAMUSCULAR | Status: AC
  Filled 2022-11-19: qty 1

## 2022-11-19 MED ORDER — FENTANYL CITRATE (PF) 100 MCG/2ML IJ SOLN
INTRAMUSCULAR | Status: DC | PRN
  Administered 2022-11-19: 100 ug via INTRAVENOUS

## 2022-11-19 MED ORDER — OXYCODONE HCL 5 MG/5ML PO SOLN: 5.0000 mg | Freq: Once | ORAL | Status: DC | PRN

## 2022-11-19 MED ORDER — HYDROMORPHONE HCL 1 MG/ML IJ SOLN
0.2500 mg | INTRAMUSCULAR | Status: DC | PRN
  Administered 2022-11-19 (×3): 0.5 mg via INTRAVENOUS

## 2022-11-19 MED ORDER — SUGAMMADEX SODIUM 200 MG/2ML IV SOLN
INTRAVENOUS | Status: DC | PRN
Start: 2022-11-19 — End: 2022-11-19
  Administered 2022-11-19: 200 mg via INTRAVENOUS

## 2022-11-19 MED ORDER — DOCUSATE SODIUM 100 MG PO CAPS
100.0000 mg | ORAL_CAPSULE | Freq: Two times a day (BID) | ORAL | Status: DC
  Administered 2022-11-19 – 2022-11-20 (×2): 100 mg via ORAL
  Filled 2022-11-19 (×2): qty 1

## 2022-11-19 MED ORDER — PHENYLEPHRINE HCL-NACL 20-0.9 MG/250ML-% IV SOLN
INTRAVENOUS | Status: AC
  Filled 2022-11-19: qty 250

## 2022-11-19 MED ORDER — SODIUM CHLORIDE 0.9 % IV SOLN: INTRAVENOUS | Status: DC

## 2022-11-19 MED ORDER — ROCURONIUM BROMIDE 10 MG/ML (PF) SYRINGE
PREFILLED_SYRINGE | INTRAVENOUS | Status: AC
  Filled 2022-11-19: qty 10

## 2022-11-19 MED ORDER — DEXAMETHASONE SODIUM PHOSPHATE 10 MG/ML IJ SOLN
INTRAMUSCULAR | Status: DC | PRN
Start: 2022-11-19 — End: 2022-11-19
  Administered 2022-11-19: 8 mg via INTRAVENOUS

## 2022-11-19 MED ORDER — OXYCODONE-ACETAMINOPHEN 5-325 MG PO TABS: 1.0000 | ORAL_TABLET | Freq: Four times a day (QID) | ORAL | 0 refills | Status: AC | PRN

## 2022-11-19 MED ORDER — PROPOFOL 10 MG/ML IV BOLUS
INTRAVENOUS | Status: AC
  Filled 2022-11-19: qty 20

## 2022-11-19 MED ORDER — MAGNESIUM CITRATE PO SOLN: 1.0000 | Freq: Once | ORAL | Status: DC

## 2022-11-19 MED ORDER — KETAMINE HCL 50 MG/5ML IJ SOSY
PREFILLED_SYRINGE | INTRAMUSCULAR | Status: AC
  Filled 2022-11-19: qty 5

## 2022-11-19 MED ORDER — DEXAMETHASONE SODIUM PHOSPHATE 10 MG/ML IJ SOLN
INTRAMUSCULAR | Status: AC
  Filled 2022-11-19: qty 1

## 2022-11-19 MED ORDER — HYDROMORPHONE HCL 1 MG/ML IJ SOLN
INTRAMUSCULAR | Status: AC
  Administered 2022-11-19: 0.5 mg via INTRAVENOUS
  Filled 2022-11-19: qty 2

## 2022-11-19 MED ORDER — ONDANSETRON HCL 4 MG/2ML IJ SOLN
INTRAMUSCULAR | Status: AC
  Filled 2022-11-19: qty 2

## 2022-11-19 MED ORDER — ACETAMINOPHEN 500 MG PO TABS
1000.0000 mg | ORAL_TABLET | Freq: Four times a day (QID) | ORAL | Status: DC
  Administered 2022-11-19 – 2022-11-20 (×4): 1000 mg via ORAL
  Filled 2022-11-19 (×4): qty 2

## 2022-11-19 MED ORDER — OXYCODONE HCL 5 MG PO TABS
5.0000 mg | ORAL_TABLET | ORAL | Status: DC | PRN
  Administered 2022-11-19 – 2022-11-20 (×2): 5 mg via ORAL
  Filled 2022-11-19 (×2): qty 1

## 2022-11-19 MED ORDER — MIDAZOLAM HCL 2 MG/2ML IJ SOLN
INTRAMUSCULAR | Status: AC
  Filled 2022-11-19: qty 2

## 2022-11-19 MED ORDER — OXYCODONE HCL 5 MG PO TABS: 5.0000 mg | ORAL_TABLET | Freq: Once | ORAL | Status: DC | PRN

## 2022-11-19 MED ORDER — BUPIVACAINE LIPOSOME 1.3 % IJ SUSP
INTRAMUSCULAR | Status: DC | PRN
  Administered 2022-11-19: 20 mL

## 2022-11-19 MED ORDER — INDOCYANINE GREEN 25 MG IV SOLR
INTRAVENOUS | Status: DC | PRN
Start: 2022-11-19 — End: 2022-11-19
  Administered 2022-11-19: 2.5 mg

## 2022-11-19 MED ORDER — ORAL CARE MOUTH RINSE: 15.0000 mL | Freq: Once | OROMUCOSAL | Status: AC

## 2022-11-19 MED ORDER — ORAL CARE MOUTH RINSE: 15.0000 mL | OROMUCOSAL | Status: DC | PRN

## 2022-11-19 MED ORDER — LACTATED RINGERS IV SOLN: INTRAVENOUS | Status: DC | PRN

## 2022-11-19 MED ORDER — SODIUM CHLORIDE (PF) 0.9 % IJ SOLN
INTRAMUSCULAR | Status: DC | PRN
  Administered 2022-11-19: 20 mL

## 2022-11-19 MED ORDER — EPHEDRINE SULFATE (PRESSORS) 50 MG/ML IJ SOLN
INTRAMUSCULAR | Status: DC | PRN
  Administered 2022-11-19: 5 mg via INTRAVENOUS
  Administered 2022-11-19: 10 mg via INTRAVENOUS
  Administered 2022-11-19: 5 mg via INTRAVENOUS

## 2022-11-19 MED ORDER — SULFAMETHOXAZOLE-TRIMETHOPRIM 800-160 MG PO TABS: 1.0000 | ORAL_TABLET | Freq: Two times a day (BID) | ORAL | 0 refills | Status: AC

## 2022-11-19 MED ORDER — LACTATED RINGERS IR SOLN
Status: DC | PRN
  Administered 2022-11-19: 1000 mL

## 2022-11-19 MED ORDER — MEPERIDINE HCL 50 MG/ML IJ SOLN: 6.2500 mg | INTRAMUSCULAR | Status: DC | PRN

## 2022-11-19 MED ORDER — ONDANSETRON HCL 4 MG/2ML IJ SOLN: 4.0000 mg | INTRAMUSCULAR | Status: DC | PRN

## 2022-11-19 MED ORDER — CHLORHEXIDINE GLUCONATE 0.12 % MT SOLN
15.0000 mL | Freq: Once | OROMUCOSAL | Status: AC
  Administered 2022-11-19: 15 mL via OROMUCOSAL

## 2022-11-19 MED ORDER — CEFAZOLIN SODIUM-DEXTROSE 2-4 GM/100ML-% IV SOLN
2.0000 g | INTRAVENOUS | Status: AC
  Administered 2022-11-19: 2 g via INTRAVENOUS
  Filled 2022-11-19: qty 100

## 2022-11-19 SURGICAL SUPPLY — 72 items
ADH SKN CLS APL DERMABOND .7 (GAUZE/BANDAGES/DRESSINGS) ×2
APL PRP STRL LF DISP 70% ISPRP (MISCELLANEOUS) ×2
APL SWBSTK 6 STRL LF DISP (MISCELLANEOUS) ×2
APPLICATOR COTTON TIP 6 STRL (MISCELLANEOUS) ×2 IMPLANT
APPLICATOR COTTON TIP 6IN STRL (MISCELLANEOUS) ×2
BAG COUNTER SPONGE SURGICOUNT (BAG) IMPLANT
BAG SPNG CNTER NS LX DISP (BAG)
CATH FOLEY 2WAY SLVR 18FR 30CC (CATHETERS) ×2 IMPLANT
CATH TIEMANN FOLEY 18FR 5CC (CATHETERS) ×2 IMPLANT
CHLORAPREP W/TINT 26 (MISCELLANEOUS) ×2 IMPLANT
CLIP LIGATING HEM O LOK PURPLE (MISCELLANEOUS) ×4 IMPLANT
CNTNR URN SCR LID CUP LEK RST (MISCELLANEOUS) ×2 IMPLANT
CONT SPEC 4OZ STRL OR WHT (MISCELLANEOUS) ×2
COVER SURGICAL LIGHT HANDLE (MISCELLANEOUS) ×2 IMPLANT
COVER TIP SHEARS 8 DVNC (MISCELLANEOUS) ×2 IMPLANT
CUTTER ECHEON FLEX ENDO 45 340 (ENDOMECHANICALS) ×2 IMPLANT
DERMABOND ADVANCED .7 DNX12 (GAUZE/BANDAGES/DRESSINGS) ×2 IMPLANT
DRAPE ARM DVNC X/XI (DISPOSABLE) ×8 IMPLANT
DRAPE COLUMN DVNC XI (DISPOSABLE) ×2 IMPLANT
DRAPE SURG IRRIG POUCH 19X23 (DRAPES) ×2 IMPLANT
DRIVER NDL LRG 8 DVNC XI (INSTRUMENTS) ×4 IMPLANT
DRIVER NDLE LRG 8 DVNC XI (INSTRUMENTS) ×4
DRSG TEGADERM 4X4.75 (GAUZE/BANDAGES/DRESSINGS) ×2 IMPLANT
ELECT PENCIL ROCKER SW 15FT (MISCELLANEOUS) ×2 IMPLANT
ELECT REM PT RETURN 15FT ADLT (MISCELLANEOUS) ×2 IMPLANT
FORCEPS BPLR LNG DVNC XI (INSTRUMENTS) ×2 IMPLANT
FORCEPS PROGRASP DVNC XI (FORCEP) ×2 IMPLANT
GAUZE 4X4 16PLY ~~LOC~~+RFID DBL (SPONGE) IMPLANT
GAUZE SPONGE 2X2 8PLY STRL LF (GAUZE/BANDAGES/DRESSINGS) IMPLANT
GAUZE SPONGE 4X4 12PLY STRL (GAUZE/BANDAGES/DRESSINGS) ×2 IMPLANT
GLOVE BIO SURGEON STRL SZ 6.5 (GLOVE) ×2 IMPLANT
GLOVE BIOGEL PI IND STRL 7.5 (GLOVE) ×2 IMPLANT
GLOVE SURG LX STRL 7.5 STRW (GLOVE) ×4 IMPLANT
GOWN STRL REUS W/ TWL XL LVL3 (GOWN DISPOSABLE) ×4 IMPLANT
GOWN STRL REUS W/TWL XL LVL3 (GOWN DISPOSABLE) ×4
GOWN STRL SURGICAL XL XLNG (GOWN DISPOSABLE) ×2 IMPLANT
HOLDER FOLEY CATH W/STRAP (MISCELLANEOUS) ×2 IMPLANT
IRRIG SUCT STRYKERFLOW 2 WTIP (MISCELLANEOUS) ×2
IRRIGATION SUCT STRKRFLW 2 WTP (MISCELLANEOUS) ×2 IMPLANT
IV LACTATED RINGERS 1000ML (IV SOLUTION) ×2 IMPLANT
KIT PROCEDURE DVNC SI (MISCELLANEOUS) ×2 IMPLANT
KIT TURNOVER KIT A (KITS) IMPLANT
NDL INSUFFLATION 14GA 120MM (NEEDLE) ×2 IMPLANT
NDL SPNL 22GX7 QUINCKE BK (NEEDLE) ×2 IMPLANT
NEEDLE INSUFFLATION 14GA 120MM (NEEDLE) ×2
NEEDLE SPNL 22GX7 QUINCKE BK (NEEDLE) ×2
PACK ROBOT UROLOGY CUSTOM (CUSTOM PROCEDURE TRAY) ×2 IMPLANT
PAD POSITIONING PINK XL (MISCELLANEOUS) ×2 IMPLANT
PLUG CATH AND CAP STRL 200 (CATHETERS) ×2 IMPLANT
PORT ACCESS TROCAR AIRSEAL 12 (TROCAR) ×2 IMPLANT
RELOAD STAPLE 45 4.1 GRN THCK (STAPLE) ×2 IMPLANT
SCISSORS MNPLR CVD DVNC XI (INSTRUMENTS) ×2 IMPLANT
SEAL UNIV 5-12 XI (MISCELLANEOUS) ×8 IMPLANT
SET CYSTO W/LG BORE CLAMP LF (SET/KITS/TRAYS/PACK) IMPLANT
SET TRI-LUMEN FLTR TB AIRSEAL (TUBING) ×2 IMPLANT
SOL ELECTROSURG ANTI STICK (MISCELLANEOUS) ×2
SOL PREP POV-IOD 4OZ 10% (MISCELLANEOUS) ×2 IMPLANT
SOLUTION ELECTROSURG ANTI STCK (MISCELLANEOUS) ×2 IMPLANT
SPIKE FLUID TRANSFER (MISCELLANEOUS) ×2 IMPLANT
SPONGE T-LAP 4X18 ~~LOC~~+RFID (SPONGE) ×2 IMPLANT
STAPLE RELOAD 45 GRN (STAPLE) ×2
SUT ETHILON 3 0 PS 1 (SUTURE) ×2 IMPLANT
SUT MNCRL AB 4-0 PS2 18 (SUTURE) ×4 IMPLANT
SUT PDS AB 1 CT1 27 (SUTURE) ×4 IMPLANT
SUT VIC AB 2-0 CT2 27 (SUTURE) IMPLANT
SUT VIC AB 2-0 SH 27 (SUTURE) ×2
SUT VIC AB 2-0 SH 27X BRD (SUTURE) ×2 IMPLANT
SUT VICRYL 0 UR6 27IN ABS (SUTURE) ×2 IMPLANT
SUT VLOC BARB 180 ABS3/0GR12 (SUTURE) ×6
SUTURE VLOC BRB 180 ABS3/0GR12 (SUTURE) ×6 IMPLANT
SYR 27GX1/2 1ML LL SAFETY (SYRINGE) ×2 IMPLANT
WATER STERILE IRR 1000ML POUR (IV SOLUTION) ×2 IMPLANT

## 2022-11-19 NOTE — Plan of Care (Signed)
  Problem: Education: Goal: Knowledge of the procedure and recovery process will improve Outcome: Progressing   Problem: Pain Management: Goal: General experience of comfort will improve Outcome: Progressing   Problem: Skin Integrity: Goal: Demonstration of wound healing without infection will improve Outcome: Progressing   Problem: Education: Goal: Knowledge of General Education information will improve Description: Including pain rating scale, medication(s)/side effects and non-pharmacologic comfort measures Outcome: Progressing   Problem: Coping: Goal: Level of anxiety will decrease Outcome: Progressing   Problem: Pain Managment: Goal: General experience of comfort will improve Outcome: Progressing   Problem: Safety: Goal: Ability to remain free from injury will improve Outcome: Progressing

## 2022-11-19 NOTE — Anesthesia Procedure Notes (Signed)
Procedure Name: Intubation Date/Time: 11/19/2022 7:42 AM  Performed by: Johnette Abraham, CRNAPre-anesthesia Checklist: Patient identified, Emergency Drugs available, Suction available and Patient being monitored Patient Re-evaluated:Patient Re-evaluated prior to induction Oxygen Delivery Method: Circle System Utilized Preoxygenation: Pre-oxygenation with 100% oxygen Induction Type: IV induction Ventilation: Mask ventilation without difficulty Laryngoscope Size: Mac and 4 Grade View: Grade II Tube type: Oral Tube size: 8.0 mm Number of attempts: 1 Airway Equipment and Method: Stylet and Oral airway Placement Confirmation: ETT inserted through vocal cords under direct vision, positive ETCO2 and breath sounds checked- equal and bilateral Secured at: 22 cm Tube secured with: Tape Dental Injury: Teeth and Oropharynx as per pre-operative assessment

## 2022-11-19 NOTE — Op Note (Unsigned)
[name], Stone MEDICAL RECORD NO: [id number] ACCOUNT NO: [id number] DATE OF BIRTH: 08-Aug-1965 FACILITY: [facility] LOCATION: [facility] PHYSICIAN: Sebastian Ache, MD  Operative Report   DATE OF PROCEDURE: 11/19/2022  PREOPERATIVE DIAGNOSIS: Prostate cancer.  PROCEDURES:   1.  Robotic-assisted laparoscopic radical prostatectomy. 2.  Bilateral pelvic lymphadenectomy.  ESTIMATED BLOOD LOSS:  2 mL.  COMPLICATIONS:  None.  SPECIMEN:   1.  Radical prostatectomy. 2.  Right external iliac lymph nodes. 3.  Right obturator lymph nodes. 4.  Left external iliac lymph nodes. 5.  Left obturator lymph nodes. 6.  Left periprostatic lymph nodes.  FINDINGS:   1.  Small median lobe was anticipated. 2.  Several sentinel lymph nodes.  These were denoted on pathology requisition.  DRAINS:   1.  Jackson-Pratt drain to bulb suction 2.  Foley catheter per urethra to straight drain.  ASSISTANT:  San Jetty, M.D.  INDICATIONS:  The patient is a pleasant 57 year old man who was found on workup of PSA to have adenocarcinoma of the prostate, although not large volume. This was grade 3 disease and given his young age and excellent functional status he elected a  curative intent therapy.  His prostate was rather large, approximately 90 grams with a small median lobe.  He already does have some baseline obstructive symptoms.  Given the constellation of findings, he elected to undergo primary surgery with curative  intent.  Informed consent was obtained and placed in medical record.  PROCEDURE DETAILS:  The patient being identified and was verified. Procedure being radical prostatectomy and lymph node dissection was confirmed.  Procedure timeout was performed.  Intravenous antibiotics were administered.  General endotracheal  anesthesia induced.  The patient was placed into a low lithotomy position.  Sterile field was created, prepped and draped the patient's penis, perineum, and proximal thighs using iodine  and his infra-xiphoid abdomen using chlorhexidine gluconate.  He was  further fastened to operating table using 3-inch tape over foam padding across supraxiphoid chest.  His arms were tucked to the side with gel rolls.  Test of steep Trendelenburg positioning was performed and found to be suitably positioned.  Foley  catheter was placed per urethra to straight drain.  Next, a high flow, low pressure, pneumoperitoneum was obtained using Veress technique in the supraumbilical midline having passed the aspiration and drop test.  An 8 mm robotic camera port was then  placed in the same location.  Laparoscopic examination of peritoneal cavity noticed no visceral adhesions, no visceral injury.  Additional ports were placed as follows:  Right paramedian 8 mm robotic port, right far lateral 12 mm AirSeal assist port,  right paramedian 5 mm suction port, left paramedian 8 mm robotic port, left far lateral 8 mm robotic port.  Robot was docked and passed the electronic checks.  Initial attention was directed at development of space of Retzius.  Incision was made lateral  to the right medial umbilical ligament from the midline towards the area of the internal ring coursing along the iliac vessels towards the area of the right vas deferens, which was positively identified, ligated using medial bucket handle and the right  bladder wall was swept away from the pelvic sidewall towards the area of the endopelvic fascia on the right side.  A mirror image dissection was performed in the left side.  Anterior attachment was taken down with cautery scissors.  This exposed the  anterior base of the prostate, which was defatted to better denote the bladder neck prostate junction.  This was set aside labeled as periprostatic fat.  Next, 0.2 mL of indocyanine green dye was injected in each lobe of the prostate using a  percutaneously placed robotically guided spinal needle for sentinel lymphangiography.  Next, the endopelvic fascia  was carefully swept away from the lateral aspect of the prostate in base to apex orientation.  This exposed the dorsal venous complex was  carefully controlled using green load stapler, it had been approximately 10 minutes post-dye injection, the pelvis was inspected under near infrared fluorescence light.  Sentinel lymphangiography revealed excellent parenchymal uptake of the prostate with multiple sentinel lymphatic channels on both sides.  There were two obvious areas of sentinel lymph nodes one on the left and one on the right.  These were denoted on  pathology requisition.  Standard template lymphadenectomy was performed on the right side of the right external iliac group with boundaries being right external iliac artery, vein, pelvic sidewall.  Lymphostasis achieved with cold clips, set aside  labeled as right external iliac lymph nodes.  There were sentinel lymph nodes within this.  Next, right obturator group was dissected free with boundaries being external iliac vein, pelvic sidewall, obturator nerve.  Lymphostasis achieved with cold clips  set aside labeled as right obturator lymph nodes.  Next, the left external iliac and left obturator groups were also dissected respectively.  There was additional sentinel lymphatic tissue in the left periprostatic location, it was dissected free and  set aside as well.  Attention was directed at bladder dissection.  Bladder neck was identified moving the Foley catheter back and forth and a lateral release was performed on each side to better denote the bladder neck prostate junction, which was then  separated in anterior and posterior direction, keeping what appeared to be a rim of circular muscle fibers with each plane of dissection.  As anticipated, there was a small median lobe and some intravesical protrusion of the lateral lobes.  A stay suture  of 2-0 Vicryl was placed in the area of the median lobe.  This was used as superior traction to very carefully  dissect the posterior mucosa off the aspect of the median lobe.  I was quite happy with the overall bladder neck caliber consistent with  median lobe formation and was not felt that any tailoring would be warranted.  Posterior dissection was performed by incising approximately 2 cm inferior to the posterior lip of the prostate entering the plane of Denonvilliers.  Bilateral vas deferens  were encountered, dissected for a distance of approximately 4 cm, ligated and placed on gentle superior traction.  Bilateral seminal vesicles were dissected to their tip and placed on gentle superior traction.  Dissection continued inferiorly towards the  area of the apex of the prostate, which was denoted by anterior curvature.  This exposed the vascular pedicles, which were controlled using a sequential clipping technique in a base to apex orientation performing moderate nerve sparing bilaterally in  the area of the right apex.  This was very carefully inspected given the right apical disease.  There were no obvious gross disease.  Therefore, I felt that nerve sparing would be reasonable.  Final apical dissection was performed in the anterior plane  placing the prostate on superior traction transecting the membranous urethra coldly.  This completely freed up the prostatectomy specimen was placed into EndoCatch bag for later retrieval.  Next, digital rectal exam was performed using indicator glove  and no evidence of rectal violation was noted.  Posterior reconstruction  was performed using a 3-0 V-Loc suture, reapproximating the posterior urethral plate. The posterior bladder neck, bringing the structures into tension-free apposition and  mucosa-to-mucosa anastomosis was performed using double arm 3-0 V-Loc suture from the 6 o'clock to 12 o'clock position.  This resulted in excellent tension-free apposition.  A Foley catheter was placed, which irrigated quantitatively.  Sponge and needle  counts were correct.  Hemostasis  was excellent.  A closed suction drain was brought out the previous left lateral most robotic port site into the peritoneal cavity.  The previous right lateral most assistant port site was closed at the level of fascia  using Carter-Thomason suture passer and 0 Vicryl.  Specimen was retrieved by extending the previous camera port site for a total distance of approximately 4 cm, removing the prostatectomy specimen setting aside for permanent pathology.  This site was  closed at the level of fascia using figure-of-eight PDS x4 followed by reapproximation of Scarpa's with running Vicryl.  All incision sites were infiltrated with dilute lipolyzed Marcaine and closed at the level of the skin using subcuticular Monocryl  followed by Dermabond.  Procedure was then terminated.  The patient tolerated the procedure well, no immediate periprocedural complications.  The patient was taken to the postanesthesia care unit in stable condition.   PUS D: 11/19/2022 10:54:20 am T: 11/19/2022 11:39:00 am  JOB: [id number]/ [id number]

## 2022-11-19 NOTE — Anesthesia Preprocedure Evaluation (Addendum)
Anesthesia Evaluation    Airway Mallampati: II  TM Distance: >3 FB Neck ROM: Full    Dental  (+) Edentulous Upper, Upper Dentures   Pulmonary    Pulmonary exam normal breath sounds clear to auscultation       Cardiovascular hypertension, Pt. on medications Normal cardiovascular exam Rhythm:Regular Rate:Normal     Neuro/Psych    GI/Hepatic   Endo/Other    Renal/GU      Musculoskeletal   Abdominal  (+) + obese  Peds  Hematology   Anesthesia Other Findings Prostate Cancer  Reproductive/Obstetrics                             Anesthesia Physical Anesthesia Plan  ASA: 3  Anesthesia Plan: General   Post-op Pain Management: Dilaudid IV   Induction: Intravenous  PONV Risk Score and Plan: 2 and Ondansetron, Midazolam and Treatment may vary due to age or medical condition  Airway Management Planned: Oral ETT  Additional Equipment:   Intra-op Plan:   Post-operative Plan: Extubation in OR  Informed Consent: I have reviewed the patients History and Physical, chart, labs and discussed the procedure including the risks, benefits and alternatives for the proposed anesthesia with the patient or authorized representative who has indicated his/her understanding and acceptance.     Dental advisory given  Plan Discussed with: CRNA  Anesthesia Plan Comments:        Anesthesia Quick Evaluation

## 2022-11-19 NOTE — Discharge Instructions (Signed)
1- Drain Sites - You may have some mild persistent drainage from old drain site for several days, this is normal. This can be covered with cotton gauze for convenience.  2 - Stiches - Your stitches are all dissolvable. You may notice a "loose thread" at your incisions, these are normal and require no intervention. You may cut them flush to the skin with fingernail clippers if needed for comfort.  3 - Diet - No restrictions  4 - Activity - No heavy lifting / straining (any activities that require valsalva or "bearing down") x 4 weeks. Otherwise, no restrictions.  5 - Bathing - You may shower immediately. Do not take a bath or get into swimming pool where incision sites are submersed in water x 4 weeks.   6 - Catheter - Will remain in place until removed at your next appointment. It may be cleaned with soap and water in the shower. It may be disconnected from the drain bag while in the shower to avoid tripping over the tube. You may apply Neosporin or Vaseline ointment as needed to the tip of the penis where the catheter inserts to reduce friction and irritation in this spot.   7 - When to Call the Doctor - Call MD for any fever >102, any acute wound problems, or any severe nausea / vomiting. You can call the [facility] ([phone]) 24 hours a day 365 days a year. It will roll-over to the answering service and on-call physician after hours.  

## 2022-11-19 NOTE — Anesthesia Postprocedure Evaluation (Signed)
Anesthesia Post Note  Patient: Daniel Stone  Procedure(s) Performed: XI ROBOTIC ASSISTED LAPAROSCOPIC RADICAL PROSTATECTOMY WITH INDOCYANINE GREEN DYE INJECTION PELVIC  LYMPH NODE DISSECTION (Bilateral)     Patient location during evaluation: PACU Anesthesia Type: General Level of consciousness: awake and alert Pain management: pain level controlled Vital Signs Assessment: post-procedure vital signs reviewed and stable Respiratory status: spontaneous breathing, nonlabored ventilation and respiratory function stable Cardiovascular status: blood pressure returned to baseline and stable Postop Assessment: no apparent nausea or vomiting Anesthetic complications: no   No notable events documented.  Last Vitals:  Vitals:   11/19/22 1200 11/19/22 1215  BP: 121/78 124/75  Pulse: 89 83  Resp: 15 13  Temp:    SpO2: (!) 89% 96%    Last Pain:  Vitals:   11/19/22 1215  TempSrc:   PainSc: 6                  Lowella Curb

## 2022-11-19 NOTE — H&P (Signed)
Daniel Stone is an 57 y.o. male.    Chief Complaint: Pre-Op Prostatectomy  HPI:     1 - Moderate Risk Prostate Cancer - 1/12 cores (RLA) up to 50% grade 3 cancer on BX 08/2022 on eval PSA 12.7. TRUS 90mL with small median. MRI localized.   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 NO hydro.   ??PMH sig for HTN, HLD. NO CV disease. NO blood thinners. NO prior abd surgeries. His wife Daniel Stone is very involved. 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 His PCP is Daniel Arbour MD   ??Today " [name] " is seen to proceed with prostatectomy / node dissection. No interval feers. Hgb 15, Cr 0.8.    Past Medical History:  Diagnosis Date   Carpal tunnel syndrome on both sides    CTS (carpal tunnel syndrome)    HTN (hypertension)    Hyperlipidemia    Pancreatitis    Pneumonia    Staph skin infection    back   Unilateral agenesis of kidney     Past Surgical History:  Procedure Laterality Date   COLONOSCOPY  07/02/2020   VASECTOMY      Family History  Problem Relation Age of Onset   Stroke Mother    Aneurysm Mother    Coronary artery disease Paternal Grandmother    Emphysema Maternal Aunt    Emphysema Maternal Uncle    Lung cancer Father    Alpha-1 antitrypsin deficiency Father    Alpha-1 antitrypsin deficiency Paternal Aunt    Colon cancer Neg Hx    Esophageal cancer Neg Hx    Rectal cancer Neg Hx    Stomach cancer Neg Hx    Social History:  reports that he has never smoked. He quit smokeless tobacco use about 24 years ago. He reports current alcohol use of about 2.0 - 3.0 standard drinks of alcohol per week. He reports that he does not currently use drugs.  Allergies: No Known Allergies  No medications prior to admission.    No results found for this or any previous visit (from the past 48 hour(s)). No results found.  Review of Systems  All other systems reviewed and are negative.   There were no vitals taken  for this visit. Physical Exam Vitals reviewed.  HENT:     Head: Normocephalic.  Eyes:     Pupils: Pupils are equal, round, and reactive to light.  Cardiovascular:     Rate and Rhythm: Normal rate.  Pulmonary:     Effort: Pulmonary effort is normal.  Abdominal:     General: Abdomen is flat.  Genitourinary:    Comments: No CVAT Musculoskeletal:        General: Normal range of motion.     Cervical back: Normal range of motion.  Skin:    General: Skin is warm.  Neurological:     Mental Status: He is alert.  Psychiatric:        Mood and Affect: Mood normal.      Assessment/Plan  Proceed as planned with robotic prostatecotmy / node dissection. Risks, benefits, alternatives, expected peri-o course discussed previously and reiterated today.   Loletta Parish., MD 11/19/2022, 5:24 AM

## 2022-11-19 NOTE — TOC Initial Note (Signed)
Transition of Care [facility]) - Initial/Assessment Note    Patient Details  Name: Daniel Stone MRN: [id number] Date of Birth: [birth year]  Transition of [facility]) CM/SW Contact:    Lanier Clam, RN Phone Number: 11/19/2022, 3:13 PM  Clinical Narrative: d/c home. Monitor for d/c needs.                  Expected Discharge Plan: Home/Self Care Barriers to Discharge: Continued Medical Work up   Patient Goals and CMS Choice Patient states their goals for this hospitalization and ongoing recovery are:: Home CMS Medicare.gov Compare Post Acute Care list provided to:: Patient Choice offered to / list presented to : Patient [place] ownership interest in [facility].provided to:: Patient    Expected Discharge Plan and Services   Discharge Planning Services: CM Consult   Living arrangements for the past 2 months: Single Family Home                                      Prior Living Arrangements/Services Living arrangements for the past 2 months: Single Family Home Lives with:: Spouse Patient language and need for interpreter reviewed:: Yes Do you feel safe going back to the place where you live?: Yes      Need for Family Participation in Patient Care: Yes (Comment) Care giver support system in place?: Yes (comment)   Criminal Activity/Legal Involvement Pertinent to Current Situation/Hospitalization: No - Comment as needed  Activities of Daily Living Home Assistive Devices/Equipment: Dentures (specify type), Eyeglasses (upper partial) ADL Screening (condition at time of admission) Patient's cognitive ability adequate to safely complete daily activities?: Yes Is the patient deaf or have difficulty hearing?: No Does the patient have difficulty seeing, even when wearing glasses/contacts?: No Does the patient have difficulty concentrating, remembering, or making decisions?: No Patient able to express need for assistance with ADLs?: Yes Does the patient have  difficulty dressing or bathing?: No Independently performs ADLs?: Yes (appropriate for developmental age) Does the patient have difficulty walking or climbing stairs?: No Weakness of Legs: None Weakness of Arms/Hands: None  Permission Sought/Granted Permission sought to share information with : Case Manager Permission granted to share information with : Yes, Verbal Permission Granted  Share Information with NAME: Case manager           Emotional Assessment Appearance:: Appears stated age Attitude/Demeanor/Rapport: Gracious Affect (typically observed): Accepting Orientation: : Oriented to Self, Oriented to Place, Oriented to  Time, Oriented to Situation Alcohol / Substance Use: Not Applicable Psych Involvement: No (comment)  Admission diagnosis:  S/P prostatectomy [Z90.79] Patient Active Problem List   Diagnosis Date Noted   S/P prostatectomy 11/19/2022   Lower abdominal pain 04/28/2020   Alpha-1-antitrypsin deficiency carrier 07/30/2011   ABDOMINAL PAIN-EPIGASTRIC 02/19/2008   ABNORMAL FINDINGS GI TRACT 02/19/2008   HYPERLIPIDEMIA 02/08/2008   CARPAL TUNNEL SYNDROME, BILATERAL 02/08/2008   HYPERTENSION 02/08/2008   PANCREATITIS 02/08/2008   PCP:  Garlan Fillers, MD Pharmacy:   [facility] [phone] [place], [place] - [address] [place] [address] [place] [address] Phone: [phone] Fax: [phone]     Social Determinants of Health (SDOH) Social History: SDOH Screenings   Food Insecurity: No Food Insecurity (11/19/2022)  Housing: Low Risk  (11/19/2022)  Transportation Needs: No Transportation Needs (11/19/2022)  Utilities: Not At Risk (11/19/2022)  Tobacco Use: Medium Risk (11/19/2022)   SDOH Interventions:  Readmission Risk Interventions     No data to display

## 2022-11-19 NOTE — Brief Op Note (Signed)
11/19/2022  10:44 AM  PATIENT:  Daniel Stone  57 y.o. male  PRE-OPERATIVE DIAGNOSIS:  PROSTATE CANCER  POST-OPERATIVE DIAGNOSIS:  PROSTATE CANCER  PROCEDURE:  Procedure(s) with comments: XI ROBOTIC ASSISTED LAPAROSCOPIC RADICAL PROSTATECTOMY WITH INDOCYANINE GREEN DYE INJECTION (N/A) - 3 HRS PELVIC  LYMPH NODE DISSECTION (Bilateral)  SURGEON:  Surgeons and Role:    * Mercedez Boule, Delbert Phenix., MD - Primary  PHYSICIAN ASSISTANT:   ASSISTANTS: Cathren Harsh MD   ANESTHESIA:   local and general  EBL:  200 mL   BLOOD ADMINISTERED:none  DRAINS:  foley to gravity; JP to bulb    LOCAL MEDICATIONS USED:  MARCAINE     SPECIMEN:  Source of Specimen:  1- prostate; 2 - pelvic lymph nodes  DISPOSITION OF SPECIMEN:  PATHOLOGY  COUNTS:  YES  TOURNIQUET:  * No tourniquets in log *  DICTATION: .Other Dictation: Dictation Number [id number]  PLAN OF CARE: Admit for overnight observation  PATIENT DISPOSITION:  PACU - hemodynamically stable.   Delay start of Pharmacological VTE agent (>24hrs) due to surgical blood loss or risk of bleeding: yes

## 2022-11-19 NOTE — Transfer of Care (Signed)
Immediate Anesthesia Transfer of Care Note  Patient: Daniel Stone  Procedure(s) Performed: XI ROBOTIC ASSISTED LAPAROSCOPIC RADICAL PROSTATECTOMY WITH INDOCYANINE GREEN DYE INJECTION PELVIC  LYMPH NODE DISSECTION (Bilateral)  Patient Location: PACU  Anesthesia Type:General  Level of Consciousness: awake, alert , oriented, and patient cooperative  Airway & Oxygen Therapy: Patient Spontanous Breathing and Patient connected to face mask oxygen  Post-op Assessment: Report given to RN and Post -op Vital signs reviewed and stable  Post vital signs: Reviewed and stable  Last Vitals:  Vitals Value Taken Time  BP 128/83 11/19/22 1105  Temp 36.5 C 11/19/22 1105  Pulse 86 11/19/22 1111  Resp 11 11/19/22 1111  SpO2 98 % 11/19/22 1111  Vitals shown include unfiled device data.  Last Pain:  Vitals:   11/19/22 0605  TempSrc:   PainSc: 0-No pain         Complications: No notable events documented.

## 2022-11-20 ENCOUNTER — Encounter (HOSPITAL_COMMUNITY): Payer: Self-pay | Admitting: Urology

## 2022-11-20 DIAGNOSIS — C61 Malignant neoplasm of prostate: Secondary | ICD-10-CM | POA: Diagnosis not present

## 2022-11-20 LAB — BASIC METABOLIC PANEL
Anion gap: 5 (ref 5–15)
BUN: 12 mg/dL (ref 6–20)
CO2: 26 mmol/L (ref 22–32)
Calcium: 8.4 mg/dL — ABNORMAL LOW (ref 8.9–10.3)
Chloride: 106 mmol/L (ref 98–111)
Creatinine, Ser: 0.92 mg/dL (ref 0.61–1.24)
GFR, Estimated: >60 mL/min (ref >60)
Glucose, Bld: 103 mg/dL — ABNORMAL HIGH (ref 70–99)
Potassium: 4.6 mmol/L (ref 3.5–5.1)
Sodium: 137 mmol/L (ref 135–145)

## 2022-11-20 LAB — HEMOGLOBIN AND HEMATOCRIT, BLOOD
HCT: 42.4 % (ref 39.0–52.0)
Hemoglobin: 14.2 g/dL (ref 13.0–17.0)

## 2022-11-20 MED ORDER — CHLORHEXIDINE GLUCONATE CLOTH 2 % EX PADS
6.0000 | MEDICATED_PAD | Freq: Every day | CUTANEOUS | Status: DC
  Administered 2022-11-20: 6 via TOPICAL

## 2022-11-20 MED ORDER — SIMETHICONE 80 MG PO CHEW: 80.0000 mg | CHEWABLE_TABLET | Freq: Four times a day (QID) | ORAL | Status: DC | PRN

## 2022-11-20 NOTE — Discharge Summary (Signed)
Physician Discharge Summary  Patient ID: Daniel Stone MRN: [id number] DOB/AGE: [birth year] 57 y.o.  Admit date: 11/19/2022 Discharge date: 11/20/2022  Admission Diagnoses:  Discharge Diagnoses:  Principal Problem:   S/P prostatectomy   Discharged Condition: good  Hospital Course: Patient was admitted following PROCEDURES:   1.  Robotic-assisted laparoscopic radical prostatectomy. 2.  Bilateral pelvic lymphadenectomy.  He did well.  On postop day 1 he was ambulating, tolerating a regular diet.  Afebrile and stable.  Urine clear.  Ready for discharge.  Consults:  none  Significant Diagnostic Studies: none  Treatments: surgery: PROCEDURES:   1.  Robotic-assisted laparoscopic radical prostatectomy. 2.  Bilateral pelvic lymphadenectomy.  Discharge Exam: Blood pressure 120/74, pulse 67, temperature 98.2 F (36.8 C), temperature source Oral, resp. rate 20, height 6\' 1"  (1.854 m), weight 111.1 kg, SpO2 98%. Lying in bed watching TV, alert and oriented Abdomen soft and nontender-incisions are clean dry and intact GU-Foley in place with clear urine Extremity-no calf pain or swelling  Disposition: Discharge disposition: 01-Home or Self Care       Discharge Instructions     Continue foley catheter   Complete by: As directed       Allergies as of 11/20/2022   No Known Allergies      Medication List     TAKE these medications    ibuprofen 200 MG tablet Commonly known as: ADVIL Take 800 mg by mouth every 6 (six) hours as needed for moderate pain.   lisinopril-hydrochlorothiazide 20-25 MG tablet Commonly known as: ZESTORETIC Take 1 tablet by mouth daily as needed (constipation).   oxyCODONE-acetaminophen 5-325 MG tablet Commonly known as: Percocet Take 1 tablet by mouth every 6 (six) hours as needed for moderate pain or severe pain (post-operatively).   sennosides-docusate sodium 8.6-50 MG tablet Commonly known as: SENOKOT-S Take 1 tablet by mouth  daily. What changed: Another medication with the same name was added. Make sure you understand how and when to take each.   senna-docusate 8.6-50 MG tablet Commonly known as: Senokot-S Take 1 tablet by mouth 2 (two) times daily. While taking strong pain meds to prevent constipation What changed: You were already taking a medication with the same name, and this prescription was added. Make sure you understand how and when to take each.   simvastatin 20 MG tablet Commonly known as: ZOCOR Take 20 mg by mouth every evening.   sulfamethoxazole-trimethoprim 800-160 MG tablet Commonly known as: BACTRIM DS Take 1 tablet by mouth 2 (two) times daily. X 3 days. Begin day before next Urology appointment.        Follow-up Information     Berneice Heinrich Delbert Phenix., MD Follow up on 11/30/2022.   Specialty: Urology Why: at 8 AM for MD visit, pathology review, and catheter removal. Contact information: [address] [place] [address] [phone]                 Signed: Jerilee Field 11/20/2022, 10:45 AM

## 2022-11-20 NOTE — Plan of Care (Signed)
  Problem: Education: Goal: Knowledge of the procedure and recovery process will improve Outcome: Progressing   Problem: Bowel/Gastric: Goal: Gastrointestinal status for postoperative course will improve Outcome: Progressing   Problem: Pain Management: Goal: General experience of comfort will improve Outcome: Progressing   Problem: Skin Integrity: Goal: Demonstration of wound healing without infection will improve Outcome: Progressing   Problem: Urinary Elimination: Goal: Ability to avoid or minimize complications of infection will improve Outcome: Progressing Goal: Ability to achieve and maintain urine output will improve Outcome: Progressing Goal: Home care management will improve Outcome: Progressing   Problem: Education: Goal: Knowledge of General Education information will improve Description: Including pain rating scale, medication(s)/side effects and non-pharmacologic comfort measures Outcome: Progressing   Problem: Health Behavior/Discharge Planning: Goal: Ability to manage health-related needs will improve Outcome: Progressing   Problem: Clinical Measurements: Goal: Ability to maintain clinical measurements within normal limits will improve Outcome: Progressing Goal: Will remain free from infection Outcome: Progressing Goal: Diagnostic test results will improve Outcome: Progressing Goal: Respiratory complications will improve Outcome: Progressing Goal: Cardiovascular complication will be avoided Outcome: Progressing   Problem: Activity: Goal: Risk for activity intolerance will decrease Outcome: Progressing   Problem: Nutrition: Goal: Adequate nutrition will be maintained Outcome: Progressing   Problem: Coping: Goal: Level of anxiety will decrease Outcome: Progressing   Problem: Elimination: Goal: Will not experience complications related to bowel motility Outcome: Progressing Goal: Will not experience complications related to urinary  retention Outcome: Progressing   Problem: Pain Managment: Goal: General experience of comfort will improve Outcome: Progressing   Problem: Safety: Goal: Ability to remain free from injury will improve Outcome: Progressing   Problem: Skin Integrity: Goal: Risk for impaired skin integrity will decrease Outcome: Progressing   

## 2022-11-24 LAB — SURGICAL PATHOLOGY
